# Patient Record
Sex: Male | Born: 1935 | Race: Black or African American | Hispanic: No | Smoking: Former smoker
Health system: Southern US, Community
[De-identification: ages and names within clinical notes are randomized; demographics above are authoritative.]

## PROBLEM LIST (undated history)

## (undated) DIAGNOSIS — D649 Anemia, unspecified: Secondary | ICD-10-CM

## (undated) DIAGNOSIS — D72819 Decreased white blood cell count, unspecified: Secondary | ICD-10-CM

## (undated) DIAGNOSIS — E119 Type 2 diabetes mellitus without complications: Secondary | ICD-10-CM

## (undated) DIAGNOSIS — I4821 Permanent atrial fibrillation: Secondary | ICD-10-CM

## (undated) DIAGNOSIS — I10 Essential (primary) hypertension: Secondary | ICD-10-CM

## (undated) DIAGNOSIS — D61818 Other pancytopenia: Secondary | ICD-10-CM

## (undated) DIAGNOSIS — I509 Heart failure, unspecified: Secondary | ICD-10-CM

## (undated) DIAGNOSIS — E785 Hyperlipidemia, unspecified: Secondary | ICD-10-CM

## (undated) DIAGNOSIS — K219 Gastro-esophageal reflux disease without esophagitis: Secondary | ICD-10-CM

## (undated) HISTORY — DX: Heart failure, unspecified: I50.9

## (undated) HISTORY — DX: Anemia, unspecified: D64.9

## (undated) HISTORY — DX: Essential (primary) hypertension: I10

## (undated) HISTORY — DX: Hyperlipidemia, unspecified: E78.5

## (undated) HISTORY — DX: Other pancytopenia: D61.818

## (undated) HISTORY — DX: Permanent atrial fibrillation: I48.21

## (undated) HISTORY — DX: Decreased white blood cell count, unspecified: D72.819

## (undated) HISTORY — DX: Gastro-esophageal reflux disease without esophagitis: K21.9

## (undated) HISTORY — DX: Type 2 diabetes mellitus without complications: E11.9

---

## 2010-05-22 DIAGNOSIS — I2510 Atherosclerotic heart disease of native coronary artery without angina pectoris: Secondary | ICD-10-CM | POA: Insufficient documentation

## 2010-05-22 DIAGNOSIS — K922 Gastrointestinal hemorrhage, unspecified: Secondary | ICD-10-CM | POA: Insufficient documentation

## 2010-05-22 DIAGNOSIS — D62 Acute posthemorrhagic anemia: Secondary | ICD-10-CM | POA: Insufficient documentation

## 2012-03-29 DIAGNOSIS — I4891 Unspecified atrial fibrillation: Secondary | ICD-10-CM | POA: Insufficient documentation

## 2012-03-29 DIAGNOSIS — R109 Unspecified abdominal pain: Secondary | ICD-10-CM | POA: Insufficient documentation

## 2012-03-29 DIAGNOSIS — E119 Type 2 diabetes mellitus without complications: Secondary | ICD-10-CM | POA: Insufficient documentation

## 2012-03-29 DIAGNOSIS — I10 Essential (primary) hypertension: Secondary | ICD-10-CM | POA: Insufficient documentation

## 2012-04-03 DIAGNOSIS — R001 Bradycardia, unspecified: Secondary | ICD-10-CM | POA: Insufficient documentation

## 2015-06-25 HISTORY — PX: OTHER SURGICAL HISTORY: SHX169

## 2015-07-15 DIAGNOSIS — D3A098 Benign carcinoid tumors of other sites: Secondary | ICD-10-CM | POA: Insufficient documentation

## 2015-07-15 DIAGNOSIS — R011 Cardiac murmur, unspecified: Secondary | ICD-10-CM | POA: Insufficient documentation

## 2015-10-22 ENCOUNTER — Telehealth: Payer: Self-pay | Admitting: Hematology

## 2015-10-22 NOTE — Telephone Encounter (Signed)
Unable to schedule appt due to records being forward to [place] main headquarters. Faxed a signed release to [facility] awaiting confirmation to obtain the records. Spoke with pt regarding the delay in appt. Due medical office closing and records being forwarded to main headquarters.

## 2015-10-30 ENCOUNTER — Encounter: Payer: Self-pay | Admitting: *Deleted

## 2015-10-31 ENCOUNTER — Telehealth: Payer: Self-pay | Admitting: Hematology

## 2015-10-31 ENCOUNTER — Encounter: Payer: Self-pay | Admitting: Cardiology

## 2015-10-31 NOTE — Telephone Encounter (Signed)
Telephone call to patient's son, Octavia Bruckner, to schedule an appointment. Appointment scheduled with Dr. Irene Limbo on 7/10 at [place] to arrive at 12:30pm. Son agreed. Address and location given.

## 2015-11-03 ENCOUNTER — Telehealth: Payer: Self-pay | Admitting: Hematology

## 2015-11-03 ENCOUNTER — Ambulatory Visit (HOSPITAL_BASED_OUTPATIENT_CLINIC_OR_DEPARTMENT_OTHER): Payer: Medicare FFS | Admitting: Hematology

## 2015-11-03 ENCOUNTER — Other Ambulatory Visit (HOSPITAL_BASED_OUTPATIENT_CLINIC_OR_DEPARTMENT_OTHER): Payer: Medicare FFS

## 2015-11-03 VITALS — BP 124/83 | HR 67 | Temp 97.7°F | Resp 18 | Ht 71.0 in | Wt 157.4 lb

## 2015-11-03 DIAGNOSIS — C7A019 Malignant carcinoid tumor of the small intestine, unspecified portion: Secondary | ICD-10-CM

## 2015-11-03 DIAGNOSIS — C7B09 Secondary carcinoid tumors of other sites: Secondary | ICD-10-CM

## 2015-11-03 DIAGNOSIS — D3A019 Benign carcinoid tumor of the small intestine, unspecified portion: Secondary | ICD-10-CM

## 2015-11-03 DIAGNOSIS — C7A00 Malignant carcinoid tumor of unspecified site: Secondary | ICD-10-CM

## 2015-11-03 DIAGNOSIS — J9859 Other diseases of mediastinum, not elsewhere classified: Secondary | ICD-10-CM

## 2015-11-03 LAB — CBC & DIFF AND RETIC
BASO%: 0.9 % (ref 0.0–2.0)
BASOS ABS: 0 10*3/uL (ref 0.0–0.1)
EOS ABS: 0.1 10*3/uL (ref 0.0–0.5)
EOS%: 4.3 % (ref 0.0–7.0)
HEMATOCRIT: 36 % — AB (ref 38.4–49.9)
HEMOGLOBIN: 11.4 g/dL — AB (ref 13.0–17.1)
IMMATURE RETIC FRACT: 5.7 % (ref 3.00–10.60)
LYMPH#: 0.8 10*3/uL — AB (ref 0.9–3.3)
LYMPH%: 32.6 % (ref 14.0–49.0)
MCH: 28.4 pg (ref 27.2–33.4)
MCHC: 31.7 g/dL — AB (ref 32.0–36.0)
MCV: 89.5 fL (ref 79.3–98.0)
MONO#: 0.3 10*3/uL (ref 0.1–0.9)
MONO%: 11.1 % (ref 0.0–14.0)
NEUT#: 1.2 10*3/uL — ABNORMAL LOW (ref 1.5–6.5)
NEUT%: 51.1 % (ref 39.0–75.0)
Platelets: 143 10*3/uL (ref 140–400)
RBC: 4.02 10*6/uL — ABNORMAL LOW (ref 4.20–5.82)
RDW: 14.7 % — AB (ref 11.0–14.6)
RETIC %: 0.61 % — AB (ref 0.80–1.80)
RETIC CT ABS: 24.52 10*3/uL — AB (ref 34.80–93.90)
WBC: 2.4 10*3/uL — ABNORMAL LOW (ref 4.0–10.3)

## 2015-11-03 LAB — COMPREHENSIVE METABOLIC PANEL
ALBUMIN: 3.8 g/dL (ref 3.5–5.0)
ALT: 16 U/L (ref 0–55)
AST: 18 U/L (ref 5–34)
Alkaline Phosphatase: 92 U/L (ref 40–150)
Anion Gap: 9 mEq/L (ref 3–11)
BUN: 18.4 mg/dL (ref 7.0–26.0)
CALCIUM: 8.8 mg/dL (ref 8.4–10.4)
CHLORIDE: 107 meq/L (ref 98–109)
CO2: 24 mEq/L (ref 22–29)
CREATININE: 1.3 mg/dL (ref 0.7–1.3)
EGFR: 59 mL/min/{1.73_m2} — ABNORMAL LOW (ref >90)
Glucose: 90 mg/dl (ref 70–140)
Potassium: 3.4 mEq/L — ABNORMAL LOW (ref 3.5–5.1)
Sodium: 141 mEq/L (ref 136–145)
Total Bilirubin: 0.71 mg/dL (ref 0.20–1.20)
Total Protein: 7 g/dL (ref 6.4–8.3)

## 2015-11-03 NOTE — Telephone Encounter (Signed)
Pt confirmed appt and received avs °

## 2015-11-04 ENCOUNTER — Encounter: Payer: Self-pay | Admitting: Cardiology

## 2015-11-05 ENCOUNTER — Encounter: Payer: Self-pay | Admitting: *Deleted

## 2015-11-05 LAB — SEROTONIN SERUM: SEROTONIN, SERUM: 109 ng/mL (ref 21–321)

## 2015-11-05 LAB — CHROMOGRANIN A: Chromogranin A: 6 nmol/L — ABNORMAL HIGH (ref 0–5)

## 2015-11-06 ENCOUNTER — Encounter: Payer: Self-pay | Admitting: Cardiology

## 2015-11-06 ENCOUNTER — Ambulatory Visit (INDEPENDENT_AMBULATORY_CARE_PROVIDER_SITE_OTHER): Payer: Medicare FFS | Admitting: Cardiology

## 2015-11-06 VITALS — BP 118/79 | HR 56 | Ht 71.0 in | Wt 158.0 lb

## 2015-11-06 DIAGNOSIS — I712 Thoracic aortic aneurysm, without rupture, unspecified: Secondary | ICD-10-CM

## 2015-11-06 DIAGNOSIS — I5022 Chronic systolic (congestive) heart failure: Secondary | ICD-10-CM

## 2015-11-06 DIAGNOSIS — Z136 Encounter for screening for cardiovascular disorders: Secondary | ICD-10-CM | POA: Diagnosis not present

## 2015-11-06 DIAGNOSIS — I480 Paroxysmal atrial fibrillation: Secondary | ICD-10-CM | POA: Diagnosis not present

## 2015-11-06 DIAGNOSIS — I340 Nonrheumatic mitral (valve) insufficiency: Secondary | ICD-10-CM

## 2015-11-06 MED ORDER — METOPROLOL SUCCINATE ER 50 MG PO TB24: 50.0000 mg | ORAL_TABLET | Freq: Every day | ORAL | Status: DC

## 2015-11-06 NOTE — Patient Instructions (Signed)
Your physician recommends that you schedule a follow-up appointment in: [place] DR. [place]  Your physician has recommended you make the following change in your medication:   STOP LOPRESSOR   START TOPROL XL 50 MG DAILY  Thank you for choosing [facility]!!

## 2015-11-06 NOTE — Progress Notes (Signed)
Clinical Summary [facility] is a 82 y.o.male seen today as a [facility] patient for the following medical problems. He is self referred.   1. Chronic systolic HF - admit [id number] with CHF to [facility], appears to be Thomas diagnosis -09/2015 echo [facility] LVEF 20-25%, moderate AI, modertate to severe MR,  - clinic note from 2013 mentions LVEF 55-60%, moderate MR.  - EKG LBBB of unknown duration.   - occasional LE edema. DOE at 1 block which is chronic, though also limited due to leg weakness - compliant with meds   2. Afib - long history of afib according to notes - recent afib with RVR during 09/2015 admission at [facility] - previous hematemsis when on anticoagulant in the past, has not been on  - mention of sick sinus syndrome in the past, the specificis are not well described.  - has some occasional palpitations. Approx once episode per month.   3. Pancytopenia  4. 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   5. Mitral regurgitation - long history of MR followed by previous cardiology - by echo 09/2015 reported moderate to severe   6. 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 Unclear if missed dilated portion.  Past Medical History  Diagnosis Date  . Congestive heart failure (CHF) ([facility])   . Atrial fibrillation, permanent ([facility])   . Pancytopenia ([facility])   . Chronic anemia   . Chronic leukopenia   . GERD (gastroesophageal reflux disease)   . Hypertension   . Diabetes ([facility])      No Known Allergies   Current Outpatient Prescriptions  Medication Sig Dispense Refill  . allopurinol (ZYLOPRIM) 300 MG tablet Take by mouth.    . folic acid (FOLVITE) 1 MG tablet Take by mouth.    [name] HYDROcodone-acetaminophen (NORCO/VICODIN) 5-325 MG tablet Take by mouth.    . magnesium hydroxide (MILK OF MAGNESIA) 400  MG/5ML suspension Take by mouth.    . magnesium oxide (MAG-OX) 400 MG tablet Take by mouth.    [name] omeprazole (PRILOSEC) 40 MG capsule Take by mouth.    . ranitidine (ZANTAC) 150 MG tablet Take by mouth.     No current facility-administered medications for this visit.     Past Surgical History  Procedure Laterality Date  . Carcinoid tumor resection  06/2015    carcinoid cancer of bowel s/p removal with significant mesentary metastasis     No Known Allergies    No family history on file.   Social History [name] reports that he has never smoked. He has never used smokeless tobacco. [name] has no alcohol history on file.   Review of Systems CONSTITUTIONAL: No weight loss, fever, chills, weakness or fatigue.  HEENT: Eyes: No visual loss, blurred vision, double vision or yellow sclerae.No hearing loss, sneezing, congestion, runny nose or sore throat.  SKIN: No rash or itching.  CARDIOVASCULAR: per HPI RESPIRATORY: per HPI GASTROINTESTINAL: No anorexia, nausea, vomiting or diarrhea. No abdominal pain or blood.  GENITOURINARY: No burning on urination, no polyuria NEUROLOGICAL: No headache, dizziness, syncope, paralysis, ataxia, numbness or tingling in the extremities. No change in bowel or bladder control.  MUSCULOSKELETAL: No muscle, back pain, joint pain or stiffness.  LYMPHATICS: No enlarged nodes. No history of splenectomy.  PSYCHIATRIC: No history of depression or anxiety.  ENDOCRINOLOGIC: No  reports of sweating, cold or heat intolerance. No polyuria or polydipsia.  [name]   Physical Examination Filed Vitals:   11/06/15 1341  BP: 118/79  Pulse: 56   Filed Vitals:   11/06/15 1341  Height: 5\' 11"  (1.803 m)  Weight: 158 lb (71.668 kg)    Gen: resting comfortably, no acute distress HEENT: no scleral icterus, pupils equal round and reactive, no palptable cervical adenopathy,  CV: RRR, 3/6 systolic murmur at apex no jvd Resp: Clear to auscultation bilaterally GI:  abdomen is soft, non-tender, non-distended, normal bowel sounds, no hepatosplenomegaly MSK: extremities are warm, no edema.  Skin: warm, no rash Neuro:  no focal deficits Psych: appropriate affect    Assessment and Plan  1. Chronic systolic HF - Thomas diagnosis during 09/2015 admission. Potential etiologies include ischemic, tachycardia mediated, MR related - current status of cancer diagnosis is unclear, he has a Thomas mediastinal mass that is being worked up. Would not pursue invasive ischemic testing at this time. EKG in clinic shows afib LBBB of unknown duration, increased potential for ICM - change lopressor to Toprol XL 50mg  daily in setting of systolic dysfunction. If tolerated start low dose entresto next visit.   2. Mitral regurgitation - moderate to severe by echo - significant MR prior to diagnosis of systolic dysfunction, probable valvular as opposed to funcitonal.  - continue to monitor at this time, given his comorbidities unlikely to be candidate for surgery at any time. F/u cancer workup   3. PAF - rate controlled. No anticoag due to previous severe GI bleed when on coumadin in the past. Look to obtain more infor about this. Unclear if its related to the carcinoid tumor he had that was removed.  4. Mediastinal mass - history of CA, he is establishing with [facility]. Follow up there evaluation  5. Aortic aneurysm - remote study seen in the [facility] records from 2010, I do not see any repeat imaging though his records are spread over several different sources - continue to monitor. As mentioned above unlikely to be candidate for intervention at any point   F/u 1 month     [facility], M.D.,

## 2015-11-17 ENCOUNTER — Other Ambulatory Visit: Payer: Self-pay | Admitting: *Deleted

## 2015-11-17 MED ORDER — FUROSEMIDE 20 MG PO TABS: 20.0000 mg | ORAL_TABLET | Freq: Every day | ORAL | 6 refills | Status: DC

## 2015-11-19 ENCOUNTER — Telehealth: Payer: Self-pay | Admitting: Hematology

## 2015-11-19 NOTE — Telephone Encounter (Signed)
Appointment confirmed with patient's son,Timothy Eulas Post. Appointment letter and schedule mailed per request. Merleen Nicely.

## 2015-11-20 ENCOUNTER — Encounter: Payer: Self-pay | Admitting: Thoracic Surgery (Cardiothoracic Vascular Surgery)

## 2015-11-20 ENCOUNTER — Institutional Professional Consult (permissible substitution) (INDEPENDENT_AMBULATORY_CARE_PROVIDER_SITE_OTHER): Payer: Medicare FFS | Admitting: Thoracic Surgery (Cardiothoracic Vascular Surgery)

## 2015-11-20 ENCOUNTER — Other Ambulatory Visit: Payer: Self-pay | Admitting: *Deleted

## 2015-11-20 VITALS — BP 116/80 | Resp 20 | Ht 71.0 in | Wt 158.0 lb

## 2015-11-20 DIAGNOSIS — J9859 Other diseases of mediastinum, not elsewhere classified: Secondary | ICD-10-CM | POA: Insufficient documentation

## 2015-11-20 DIAGNOSIS — I359 Nonrheumatic aortic valve disorder, unspecified: Secondary | ICD-10-CM | POA: Insufficient documentation

## 2015-11-20 DIAGNOSIS — E785 Hyperlipidemia, unspecified: Secondary | ICD-10-CM | POA: Insufficient documentation

## 2015-11-20 DIAGNOSIS — I10 Essential (primary) hypertension: Secondary | ICD-10-CM | POA: Insufficient documentation

## 2015-11-20 NOTE — Progress Notes (Signed)
PCP is Lonia Mad, MD Referring Provider is Brunetta Genera, MD  Chief Complaint  Patient presents with  . Mediastinal Mass    Surgical eval, [facility] Chest 10/20/15    HPI: 82 year old man sent for consultation regarding mediastinal mass.  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 He is an extremely poor historian and information was obtained from those records available in [place] and his son who accompanied him.  He recently had a CT chest showed a 4.4 x 6.5 x 5.4 cm anterior mediastinal mass. There also were some moderately enlarged mediastinal and right hilar lymph nodes.  He had lost 30 pounds prior to his abdominal surgery. He says he gained weight since then. He complains of frequent heartburn. He denies any exertional correlation. He denies chest pain, pressure, or tightness but his exercise is limited because his knees get "tired" when he walks. He has about a 25-pack-year history of smoking but quit 20 years ago. He denies double vision or progressive weakness. He had no flushing, diarrhea, or wheezing to suggest carcinoid syndrome. He has chronic atrial fibrillation. He had been on anticoagulation in the past but that was stopped due to gastrointestinal bleeding.  Zubrod Score: At the time of surgery this patient's most appropriate activity status/level should be described as: []     0    Normal activity, no symptoms [x]     1    Restricted in physical strenuous activity but ambulatory, able to do out light work []     2    Ambulatory and capable of self care, unable to do work activities, up and about >50 % of waking hours                              []     3    Only limited self care, in bed  greater than 50% of waking hours []     4    Completely disabled, no self care, confined to bed or chair []     5    Moribund    Past Medical History:  Diagnosis Date  . Atrial fibrillation, permanent ([place])   . Chronic anemia   . Chronic leukopenia   . Congestive heart failure (CHF) ([place])   . Diabetes ([place])   . GERD (gastroesophageal reflux disease)   . Hyperlipidemia   . Hypertension   . Pancytopenia [facility])     Past Surgical History:  Procedure Laterality Date  . CARCINOID TUMOR RESECTION  06/2015   carcinoid cancer of bowel s/p removal with significant mesentary metastasis    Family History  Problem Relation Age of Onset  . Family history unknown: Yes    Social History Social History  Substance Use Topics  . Smoking status: Former Smoker    Packs/day: 0.50    Years: 46.00    Types: Cigarettes    Start date: 10/27/1949    Quit date: 04/27/1995  . Smokeless tobacco: Never Used  . Alcohol use Not on file    Current Outpatient Prescriptions  Medication Sig Dispense Refill  . allopurinol (ZYLOPRIM) 300 MG tablet Take 300 mg by mouth daily.     [name] aspirin EC 81 MG tablet Take 81 mg by mouth daily.    [name]  docusate sodium (COLACE) 100 MG capsule Take 100-200 mg by mouth daily as needed for mild constipation.    . folic acid (FOLVITE) 1 MG tablet Take 1 mg by mouth daily.     . furosemide (LASIX) 20 MG tablet Take 1 tablet (20 mg total) by mouth daily. 30 tablet 6  . HYDROcodone-acetaminophen (NORCO/VICODIN) 5-325 MG tablet Take 1 tablet by mouth every 6 (six) hours as needed.     . magnesium oxide (MAG-OX) 400 MG tablet Take 400 mg by mouth as needed.    . metoprolol succinate (TOPROL-XL) 50 MG 24 hr tablet Take 1 tablet (50 mg total) by mouth daily. Take with or immediately following a meal. 90 tablet 3  . omeprazole (PRILOSEC) 40 MG capsule Take 40 mg by mouth daily.      No current facility-administered medications for this visit.     No Known Allergies  Review of  Systems  Constitutional: Negative for activity change, appetite change, chills, fever and unexpected weight change.  HENT: Negative for trouble swallowing and voice change.   Eyes: Negative for visual disturbance.       Denies diplopia  Respiratory: Positive for shortness of breath (With exertion). Negative for cough.   Cardiovascular: Negative for chest pain and leg swelling.  Gastrointestinal: Positive for abdominal pain (Frequent heartburn).       History GI bleed while on anticoagulation for atrial fibrillation  Genitourinary: Negative for difficulty urinating and frequency.  Musculoskeletal: Positive for arthralgias (Knees).       Complains of both knees getting "tired" when he walks  Neurological: Negative for syncope and weakness.  Hematological: Negative for adenopathy.    BP 116/80 (BP Location: Left Arm, Patient Position: Sitting, Cuff Size: Small)   Resp 20   Ht 5\' 11"  (1.803 m)   Wt 158 lb (71.7 kg)   SpO2 93% Comment: RA  BMI 22.04 kg/m  Physical Exam  Constitutional: He is oriented to person, place, and time.  Elderly man in no acute distress  HENT:  Mouth/Throat: No oropharyngeal exudate.  Eyes: Conjunctivae and EOM are normal. No scleral icterus.  Neck: Neck supple. No thyromegaly present.  Cardiovascular: Exam reveals gallop.   Irregularly irregular with variable S2. 3/6 systolic murmur, 1/6 diastolic murmur  Pulmonary/Chest: Effort normal and breath sounds normal. No respiratory distress. He has no wheezes. He has no rales.  Abdominal: Soft. He exhibits no distension. There is no tenderness.  Musculoskeletal: He exhibits no edema.  Lymphadenopathy:    He has no cervical adenopathy.  Neurological: He is alert and oriented to person, place, and time. No cranial nerve deficit. He exhibits normal muscle tone.  Skin: Skin is warm and dry.  Vitals reviewed.    Diagnostic Tests: CT angio chest from 10/20/2015@[facility] Impression: Negative for acute  pulmonary embolism. 2. Anterior mediastinal mass. Mildly prominent mediastinal and right hilar nodes. Instrument mediastinal mass this likely neoplastic. The leading considerations or lymphoma, sarcoma, and time. 3. Bilateral pleural effusions right larger than left, both enlarged from 10/18/2015. 4. Mildly enlarged thoracic aorta Andreas Newport M.D.  I personally reviewed the CT angiogram and concur with device noted above. Official report is ascending aortas reported 4.2 cm. It appears to be more 4.5 cm. There is no indication for surgery. Nature mediastinal masses 4.4 x 6.5 x 5.4 cm. He has a significant coronary artery calcification and marked cardiomegaly which were not mentioned in the report.  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 Intravenous cell mass is most likely a thymoma or lymphoma. Suspicion for lymphoma is raised by the enlarged mediastinal and hilar nodes. However those could be just reactive  He is a poor historian and is very hard to get a feel for his functional status. His activities are limited due to his knees.   He has chronic atrial fibrillation, severe MR, moderate AI, marked cardiomegaly, and ejection fraction of 20%, and coronary calcification on his chest CT. He would need cardiac clearance prior to any thoracic surgery. He saw Dr. Harl Bowie recently and all of his issues were felt to be stable.  My recommendation at this point would be to proceed with a PET/CT to get an idea of how active the anterior mediastinal mass is, and also more importantly, evaluate the hilar and mediastinal lymph nodes. That that would change how we might approach the initial diagnosis and treatment.  He needs pulmonary function testing.  Plan: PET/CT  Obtain records from Dr. Calton Dach  Obtain records from [facility] regarding abdominal surgery  Return in 2 weeks to  discuss results  Melrose Nakayama, MD [facility] [phone] has chronic

## 2015-11-23 NOTE — Progress Notes (Signed)
[name]    HEMATOLOGY/ONCOLOGY CONSULTATION NOTE  Date of Service: 11/23/2015  Patient Care Team: Lonia Mad, MD as PCP - General (Internal Medicine)  Surgeon Dr. Valentino Saxon  Cardiology : Dr.Branch  Son - Rodnie Middendorf -[phone]    CHIEF COMPLAINTS/PURPOSE OF CONSULTATION:  Abdominal Carcinoid tumor New mediastinal mass.  HISTORY OF PRESENTING ILLNESS:   Thomas Costa. is a wonderful 82 y.o. male who has been referred to [place] by Dr Tressie Stalker for continued care for his abdominal carcinoid .   patient presented in early March 2017 with abdominal pain, 30 pound weight loss over 6 months and increasing indigestion and abdominal pain. He also had nausea and vomiting. Patient was admitted to the hospital and had a CT of the abdomen that showed possible tumor in the mesentery. He was taken to surgery by Dr. Anthony Sar on 06/27/2015 and had resection of all visible tumor. He had partial small bowel resection a lymph node biopsy and he was found to have a tumor implant in the mesenteric tissue. The primary was 1.1 x 0.5-0.4 cm, he had one lymph node found to be involved and he had a 2.0 cm tumoral implant in the mesenteric tissue which was apparently resected. Patient had no symptoms of carcinoid syndrome.  He was seen in clinic on 07/21/2015 by Dr. Tressie Stalker at [facility] in [place], [place] and baseline labs were done at the time. CBC was noted to be normal with no anemia. CMP was within normal limits with no liver function abnormalities.  The actual pathology report and the baseline lab results were not available to [place].  Patient notes he healed well from his abdominal surgery. He has been taking in short and his oral appetite has improved and he feels that he is gaining back some weight. He notes that at its lowest his weight was 135 pounds and he is back up to 157.4 pounds now. Patient reports that his good baseline is 170 pounds.  Patient has significant systolic CHF and follows up with cardiology  he had a recent echocardiogram 10/20/2015 that showed left ventricular chamber size is moderately dilated, moderate concentric LVH with severely reduced left ventricular systolic function with an estimated ejection fraction of 20-25% . Also noted to have moderate to severe mitral regurgitation and moderate aortic regurgitation.  He presented to [facility] with acute dyspnea on 10/20/2015 and had a CTA of the chest with contrast that was negative for pulmonary embolism but an anterior mediastinal mass measuring 4.4 x 6.5 x 5.4 cm was noted with a few mildly prominent mediastinal lymph nodes measuring 1.5 cm and a mildly prominent right hilar lymph node. Considerations were lymphoma versus teratoma versus thymoma. Liver lesions were also apparently noted. No significant skeletal lesions noted. Bilateral pleural effusions right greater than left.  Prior to this the patient had a CT of the abdomen and pelvis with contrast on 10/18/2015 at [facility] for evaluation of generalized abdominal pain intermittently for the last 3 months. This showed postoperative changes in the abdomen with evidence of a partial small bowel resection and removal of the mediastinal mass. Small lymph nodes in the mesentery. Numerous low-density structures in the liver which probably represent cysts. Index lesion in the posterior left hepatic lobe measures 2.4 cm and stable. Noted to have gallbladder distention with some new pericholecystic fluid.  The CT scan result was provided to [place] after the patient's clinical visit. Images were requested and disc.    MEDICAL HISTORY:  Past Medical History:  Diagnosis Date  . Atrial fibrillation, permanent ([place])   . Chronic anemia   . Chronic leukopenia   . Congestive heart failure (CHF) ([place])   . Diabetes ([place])   . GERD (gastroesophageal reflux disease)   . Hyperlipidemia   . Hypertension   . 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 CARCINOID TUMOR RESECTION  06/2015   carcinoid cancer of bowel s/p removal with significant mesentary metastasis    SOCIAL HISTORY: Social History   Social History  . Marital status: Widowed    Spouse name: N/A  . Number of children: N/A  . Years of education: N/A   Occupational History  . Not on file.   Social History Main Topics  . Smoking status: Former Smoker    Packs/day: 0.50    Years: 46.00    Types: Cigarettes    Start date: 10/27/1949    Quit date: 04/27/1995  . Smokeless tobacco: Never Used  . Alcohol use Not on file  . Drug use: Unknown  . Sexual activity: Not on file   Other Topics Concern  . Not on file   Social History Narrative  . No narrative on file    FAMILY HISTORY: Family History  Problem Relation Age of Onset  . Family history unknown: Yes  Patient denies any family history of cancers   ALLERGIES:  has No Known Allergies.  MEDICATIONS:  Current Outpatient Prescriptions  Medication Sig Dispense Refill  . allopurinol (ZYLOPRIM) 300 MG tablet Take 300 mg by mouth daily.     [name] aspirin EC 81 MG tablet Take 81 mg by mouth daily.    [name] docusate sodium (COLACE) 100 MG capsule Take 100-200 mg by mouth daily as needed for mild constipation.    . folic acid (FOLVITE) 1 MG tablet Take 1 mg by mouth daily.     . furosemide (LASIX) 20 MG tablet Take 1 tablet (20 mg total) by mouth daily. 30 tablet 6  . HYDROcodone-acetaminophen (NORCO/VICODIN) 5-325 MG tablet Take 1 tablet by mouth every 6 (six) hours as needed.     . magnesium oxide (MAG-OX) 400 MG tablet Take 400 mg by mouth as needed.    . metoprolol succinate (TOPROL-XL) 50 MG 24 hr tablet Take 1 tablet (50 mg total) by mouth daily. Take with or immediately following a meal. 90 tablet 3  . omeprazole (PRILOSEC) 40 MG capsule Take 40 mg by mouth daily.      No current facility-administered medications for this visit.      REVIEW OF SYSTEMS:    10 Point review of Systems was done is negative except as noted above.  PHYSICAL EXAMINATION: ECOG PERFORMANCE STATUS: 2 - Symptomatic, <50% confined to bed  . Vitals:   11/03/15 1319  BP: 124/83  Pulse: 67  Resp: 18  Temp: 97.7 F (36.5 C)   Filed Weights   11/03/15 1319  Weight: 157 lb 6.4 oz (71.4 kg)   .Body mass index is 21.95 kg/m.  GENERAL:alert, in no acute distress and comfortable SKIN: skin color, texture, turgor are normal, no rashes or significant lesions EYES: normal, conjunctiva are pink and non-injected, sclera clear OROPHARYNX:no exudate, no erythema and lips, buccal mucosa, and tongue normal  NECK: supple, +JVD, thyroid normal size, non-tender, without nodularity LYMPH:  no palpable lymphadenopathy in the cervical, axillary or inguinal LUNGS: Bilateral decreased breath sounds no rales no  rhonchi  HEART: Irregular rhythm, systolic murmur and mitral and aortic areas  ABDOMEN: abdomen soft, non-tender, normoactive bowel sounds healed surgical scar Musculoskeletal: Bilateral 1+ pedal edema PSYCH: alert & oriented x 3 with fluent speech NEURO: no focal motor/sensory deficits  LABORATORY DATA:  I have reviewed the data as listed  . CBC Latest Ref Rng & Units 11/03/2015  WBC 4.0 - 10.3 10e3/uL 2.4(L)  Hemoglobin 13.0 - 17.1 g/dL 11.4(L)  Hematocrit 38.4 - 49.9 % 36.0(L)  Platelets 140 - 400 10e3/uL 143    . CMP Latest Ref Rng & Units 11/03/2015  Glucose 70 - 140 mg/dl 90  BUN 7.0 - 26.0 mg/dL 18.4  Creatinine 0.7 - 1.3 mg/dL 1.3  Sodium 136 - 145 mEq/L 141  Potassium 3.5 - 5.1 mEq/L 3.4(L)  CO2 22 - 29 mEq/L 24  Calcium 8.4 - 10.4 mg/dL 8.8  Total Protein 6.4 - 8.3 g/dL 7.0  Total Bilirubin 0.20 - 1.20 mg/dL 0.71  Alkaline Phos 40 - 150 U/L 92  AST 5 - 34 U/L 18  ALT 0 - 55 U/L 16     RADIOGRAPHIC STUDIES: I have personally reviewed the radiological images as listed and agreed with the findings in the report. No  results found.  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 Anthony Sar on 06/27/2015 at [place] [facility]. Noted to have one positive lymph node and extramural nodule as per outside report. Recent CT of the abdomen and pelvis showed no overt new local tumor. Some small mediastinal lymph nodes. The liver shows multiple lesions thought to be cysts as per radiologist but cannot be sure. Plan -We will need to get the actual pathology report from Dr. Jaclyn Prime office/[facility] -We will also need his baseline carcinoid tumor markers including 24 hour HIAA , serotonin, chromogranin A and neuron-specific enolase that were done in March 2017 at [facility].  -We shall repeat the serotonin and chromogranin A levels today  -  CT chest abdomen pelvis disks [facility] from [facility]   2) anterior mediastinal mass -4.4 x 6.5 x 5.4 cm was noted with a few mildly prominent mediastinal lymph nodes. Concerning for lymphoma. Uncertain that this represents a part of his carcinoid tumor though cannot be ruled out. Plan -We'll get a PET CT scan to evaluate this further. -We'll refer the patient to Dr. Roxan Hockey cardio vascular surgery to determine best option for tissue biopsy. We will need core biopsy for lymphoma workup. -We will also discuss the case with interventional radiology. -Further management as per tissue diagnosis.  -Given the patient's significant cardiac comorbidities he will need close follow-up with his cardiologist to optimize CHF management.  All of the patients questions were answered with apparent satisfaction. The patient knows to call the clinic with any problems, questions or concerns.  I spent 60 minutes counseling the patient face to face. The total time spent in the appointment was 70 minutes and more than 50% was on counseling and  direct patient cares.    Sullivan Lone MD [place] AAHIVMS [facility] Hematology/Oncology Physician [facility]  (Office):       [phone] (Work cell):  [phone] (Fax):           [phone]

## 2015-11-24 ENCOUNTER — Ambulatory Visit (HOSPITAL_COMMUNITY)
Admission: RE | Admit: 2015-11-24 | Discharge: 2015-11-24 | Disposition: A | Discharge status: Home / Self Care | Payer: Medicare FFS | Source: Ambulatory Visit | Attending: Hematology | Admitting: Hematology

## 2015-11-24 ENCOUNTER — Ambulatory Visit (HOSPITAL_COMMUNITY)
Admission: RE | Admit: 2015-11-24 | Discharge: 2015-11-24 | Disposition: A | Discharge status: Home / Self Care | Payer: Medicare FFS | Source: Ambulatory Visit | Attending: Thoracic Surgery (Cardiothoracic Vascular Surgery) | Admitting: Thoracic Surgery (Cardiothoracic Vascular Surgery)

## 2015-11-24 DIAGNOSIS — K219 Gastro-esophageal reflux disease without esophagitis: Secondary | ICD-10-CM | POA: Insufficient documentation

## 2015-11-24 DIAGNOSIS — D61818 Other pancytopenia: Secondary | ICD-10-CM | POA: Insufficient documentation

## 2015-11-24 DIAGNOSIS — I509 Heart failure, unspecified: Secondary | ICD-10-CM | POA: Diagnosis not present

## 2015-11-24 DIAGNOSIS — F17210 Nicotine dependence, cigarettes, uncomplicated: Secondary | ICD-10-CM | POA: Diagnosis not present

## 2015-11-24 DIAGNOSIS — J9859 Other diseases of mediastinum, not elsewhere classified: Secondary | ICD-10-CM

## 2015-11-24 DIAGNOSIS — J3489 Other specified disorders of nose and nasal sinuses: Secondary | ICD-10-CM | POA: Insufficient documentation

## 2015-11-24 DIAGNOSIS — D3A019 Benign carcinoid tumor of the small intestine, unspecified portion: Secondary | ICD-10-CM

## 2015-11-24 DIAGNOSIS — R222 Localized swelling, mass and lump, trunk: Secondary | ICD-10-CM | POA: Diagnosis not present

## 2015-11-24 DIAGNOSIS — R05 Cough: Secondary | ICD-10-CM | POA: Insufficient documentation

## 2015-11-24 DIAGNOSIS — E785 Hyperlipidemia, unspecified: Secondary | ICD-10-CM | POA: Diagnosis not present

## 2015-11-24 DIAGNOSIS — D6489 Other specified anemias: Secondary | ICD-10-CM | POA: Diagnosis not present

## 2015-11-24 DIAGNOSIS — R590 Localized enlarged lymph nodes: Secondary | ICD-10-CM | POA: Insufficient documentation

## 2015-11-24 DIAGNOSIS — I110 Hypertensive heart disease with heart failure: Secondary | ICD-10-CM | POA: Insufficient documentation

## 2015-11-24 LAB — PULMONARY FUNCTION TEST
DL/VA % pred: 69 %
DL/VA: 3.21 ml/min/mmHg/L
DLCO UNC % PRED: 49 %
DLCO unc: 16.56 ml/min/mmHg
FEF 25-75 PRE: 0.65 L/s
FEF 25-75 Post: 0.75 L/sec
FEF2575-%CHANGE-POST: 15 %
FEF2575-%Pred-Post: 35 %
FEF2575-%Pred-Pre: 30 %
FEV1-%Change-Post: 6 %
FEV1-%PRED-POST: 72 %
FEV1-%Pred-Pre: 67 %
FEV1-POST: 1.99 L
FEV1-PRE: 1.87 L
FEV1FVC-%CHANGE-POST: 8 %
FEV1FVC-%Pred-Pre: 76 %
FEV6-%CHANGE-POST: 0 %
FEV6-%PRED-PRE: 84 %
FEV6-%Pred-Post: 84 %
FEV6-POST: 3.02 L
FEV6-PRE: 3.01 L
FEV6FVC-%Change-Post: 2 %
FEV6FVC-%PRED-POST: 99 %
FEV6FVC-%PRED-PRE: 96 %
FVC-%CHANGE-POST: -2 %
FVC-%Pred-Post: 85 %
FVC-%Pred-Pre: 87 %
FVC-POST: 3.24 L
FVC-Pre: 3.31 L
POST FEV6/FVC RATIO: 93 %
Post FEV1/FVC ratio: 62 %
Pre FEV1/FVC ratio: 57 %
Pre FEV6/FVC Ratio: 91 %
RV % PRED: 116 %
RV: 3.19 L
TLC % PRED: 90 %
TLC: 6.55 L

## 2015-11-24 LAB — GLUCOSE, CAPILLARY: GLUCOSE-CAPILLARY: 91 mg/dL (ref 65–99)

## 2015-11-24 MED ORDER — ALBUTEROL SULFATE (2.5 MG/3ML) 0.083% IN NEBU
2.5000 mg | INHALATION_SOLUTION | Freq: Once | RESPIRATORY_TRACT | Status: AC
  Administered 2015-11-24: 2.5 mg via RESPIRATORY_TRACT

## 2015-11-24 MED ORDER — FLUDEOXYGLUCOSE F - 18 (FDG) INJECTION: 8.5000 | Freq: Once | INTRAVENOUS | Status: DC | PRN

## 2015-11-28 ENCOUNTER — Telehealth: Payer: Self-pay | Admitting: Cardiology

## 2015-11-28 ENCOUNTER — Encounter: Payer: Self-pay | Admitting: Cardiology

## 2015-11-28 ENCOUNTER — Ambulatory Visit (INDEPENDENT_AMBULATORY_CARE_PROVIDER_SITE_OTHER): Payer: Medicare FFS | Admitting: Cardiology

## 2015-11-28 ENCOUNTER — Encounter: Payer: Self-pay | Admitting: *Deleted

## 2015-11-28 ENCOUNTER — Other Ambulatory Visit: Payer: Self-pay | Admitting: Cardiology

## 2015-11-28 VITALS — BP 127/80 | HR 92 | Ht 71.0 in | Wt 158.0 lb

## 2015-11-28 DIAGNOSIS — I340 Nonrheumatic mitral (valve) insufficiency: Secondary | ICD-10-CM | POA: Diagnosis not present

## 2015-11-28 DIAGNOSIS — I480 Paroxysmal atrial fibrillation: Secondary | ICD-10-CM

## 2015-11-28 DIAGNOSIS — I5022 Chronic systolic (congestive) heart failure: Secondary | ICD-10-CM

## 2015-11-28 DIAGNOSIS — I5021 Acute systolic (congestive) heart failure: Secondary | ICD-10-CM

## 2015-11-28 MED ORDER — SACUBITRIL-VALSARTAN 24-26 MG PO TABS: 1.0000 | ORAL_TABLET | Freq: Two times a day (BID) | ORAL | 3 refills | Status: DC

## 2015-11-28 MED ORDER — SACUBITRIL-VALSARTAN 24-26 MG PO TABS: 1.0000 | ORAL_TABLET | Freq: Two times a day (BID) | ORAL | 0 refills | Status: DC

## 2015-11-28 NOTE — Patient Instructions (Signed)
Your physician recommends that you schedule a follow-up appointment in: 3-4 WEEKS WITH DR. [place]  Your physician has recommended you make the following change in your medication:   START ENTRESTO 24/26 MG TWICE DAILY  WE HAVE GIVEN YOU SAMPLES  Your physician has requested that you have a cardiac catheterization. Cardiac catheterization is used to diagnose and/or treat various heart conditions. Doctors may recommend this procedure for a number of different reasons. The most common reason is to evaluate chest pain. Chest pain can be a symptom of coronary artery disease (CAD), and cardiac catheterization can show whether plaque is narrowing or blocking your heart's arteries. This procedure is also used to evaluate the valves, as well as measure the blood flow and oxygen levels in different parts of your heart. For further information please visit [email or web address]. Please follow instruction sheet, as given.  Thank you for choosing [facility]!!

## 2015-11-28 NOTE — Telephone Encounter (Signed)
L&RHC 12/03/15 @ 10am DR. [place]  Checking percert

## 2015-11-28 NOTE — Progress Notes (Signed)
Clinical Summary Mr. Goldner is a 82 y.o.male seen today for follow up of the following medical problems.   1. Chronic systolic HF - admit [id number] with CHF to [facility], appears to be new diagnosis -09/2015 echo [facility] LVEF 20-25%, moderate AI, modertate to severe MR,  - clinic note from 2013 mentions LVEF 55-60%, moderate MR.  - EKG LBBB of unknown duration.   - occasional LE edema but overall controlled with diuretics. . DOE at 1 block which is chronic    2. Afib - long history of afib according to notes - recent afib with RVR during 09/2015 admission at [facility] - previous hematemsis when on anticoagulant in the past, has not been on  - mention of sick sinus syndrome in the past, the specificis are not well described.   - has some occasional palpitations, overall infrequent and mild.    4. 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   6 Mitral regurgitation - long history of MR followed by previous cardiology - by echo 09/2015 reported moderate to severe   7. 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 Unclear if missed dilated portion.  - CT PE [facility] 09/2015 with mildy dilated ascedning aorta at 4.2 cm.  Past Medical History:  Diagnosis Date  . Atrial fibrillation, permanent ([place])   . Chronic anemia   . Chronic leukopenia   . Congestive heart failure (CHF) ([place])   . Diabetes ([place])   . GERD (gastroesophageal reflux disease)   . Hyperlipidemia   . Hypertension   . Pancytopenia ([place])      No Known Allergies   Current Outpatient Prescriptions  Medication Sig Dispense Refill  . allopurinol (ZYLOPRIM) 300 MG tablet Take 300 mg by mouth daily.     [name] aspirin EC 81 MG tablet Take 81 mg by mouth daily.    [name] docusate sodium (COLACE)  100 MG capsule Take 100-200 mg by mouth daily as needed for mild constipation.    . folic acid (FOLVITE) 1 MG tablet Take 1 mg by mouth daily.     . furosemide (LASIX) 20 MG tablet Take 1 tablet (20 mg total) by mouth daily. 30 tablet 6  . HYDROcodone-acetaminophen (NORCO/VICODIN) 5-325 MG tablet Take 1 tablet by mouth every 6 (six) hours as needed.     . magnesium oxide (MAG-OX) 400 MG tablet Take 400 mg by mouth as needed.    . metoprolol succinate (TOPROL-XL) 50 MG 24 hr tablet Take 1 tablet (50 mg total) by mouth daily. Take with or immediately following a meal. 90 tablet 3  . omeprazole (PRILOSEC) 40 MG capsule Take 40 mg by mouth daily.      No current facility-administered medications for this visit.    Facility-Administered Medications Ordered in Other Visits  Medication Dose Route Frequency Provider Last Rate Last Dose  . fludeoxyglucose F - 18 (FDG) injection 8.5 millicurie  8.5 millicurie Intravenous Once PRN [employer]., MD         Past Surgical History:  Procedure Laterality Date  . CARCINOID TUMOR RESECTION  06/2015   carcinoid cancer of bowel s/p removal with significant mesentary metastasis     No Known Allergies    Family History  Problem Relation Age of Onset  . Family  history unknown: Yes     Social History Mr. Diane reports that he quit smoking about 20 years ago. His smoking use included Cigarettes. He started smoking about 66 years ago. He has a 23.00 pack-year smoking history. He has never used smokeless tobacco. Mr. Michaelson has no alcohol history on file.   Review of Systems CONSTITUTIONAL: No weight loss, fever, chills, weakness or fatigue.  HEENT: Eyes: No visual loss, blurred vision, double vision or yellow sclerae.No hearing loss, sneezing, congestion, runny nose or sore throat.  SKIN: No rash or itching.  CARDIOVASCULAR: per HPI RESPIRATORY: No shortness of breath, cough or sputum.  GASTROINTESTINAL: No anorexia, nausea, vomiting or  diarrhea. No abdominal pain or blood.  GENITOURINARY: No burning on urination, no polyuria NEUROLOGICAL: No headache, dizziness, syncope, paralysis, ataxia, numbness or tingling in the extremities. No change in bowel or bladder control.  MUSCULOSKELETAL: No muscle, back pain, joint pain or stiffness.  LYMPHATICS: No enlarged nodes. No history of splenectomy.  PSYCHIATRIC: No history of depression or anxiety.  ENDOCRINOLOGIC: No reports of sweating, cold or heat intolerance. No polyuria or polydipsia.  [name]   Physical Examination Vitals:   11/28/15 1523  BP: 127/80  Pulse: 92   Vitals:   11/28/15 1523  Weight: 158 lb (71.7 kg)  Height: 5\' 11"  (1.803 m)    Gen: resting comfortably, no acute distress HEENT: no scleral icterus, pupils equal round and reactive, no palptable cervical adenopathy,  CV: RRR, no m/r/g, no jvd Resp: Clear to auscultation bilaterally GI: abdomen is soft, non-tender, non-distended, normal bowel sounds, no hepatosplenomegaly MSK: extremities are warm, no edema.  Skin: warm, no rash Neuro:  no focal deficits Psych: appropriate affect      Assessment and Plan  1. Chronic systolic HF - new diagnosis during 09/2015 admission. Potential etiologies include ischemic, tachycardia mediated, MR related - current status of cancer diagnosis is unclear, he has a new mediastinal mass that is being worked up.  - we are asked to assess his cardiac risk prior to CT surgery biopsy or resection under general anesthesia - we will plan for diagnostic  LHC/RHC to further evaluate his dysfunction and risk stratify for surgery, avoid any intervention at this time due to neccesity for upcoming chest surgery. He has not had any acute cardiac symptoms.  - start entresto 24/26 bid.    2. Mitral regurgitation - moderate to severe by echo - continue to monitor at this time.   3. PAF - rate controlled. No anticoag due to previous severe GI bleed when on coumadin in the past. It  looks like this may had been prior to the diagnosis of his colon mass that has since been removed, may consider repeat trial of anticoag in the future.   4. Mediastinal mass - concerning for malignancy, he is being considered for biopsy or resection by CT surgery.   5. Aortic aneurysm - remote study seen in the [facility] records from 2010. More recent imaging CT PE 09/2015 shows ascending aorta at 4.2 cm. Continue to monitor.    F/u 3-4 weeks  I have reviewed the risks, indications, and alternatives to cardiac catheterization, possible angioplasty, and stenting with the patient and his son today. Risks include but are not limited to bleeding, infection, vascular injury, stroke, myocardial infection, arrhythmia, kidney injury, radiation-related injury in the case of prolonged fluoroscopy use, emergency cardiac surgery, and death. The patient understands the risks of serious complication is 1-2 in [id number] with diagnostic cardiac cath and 1-2% or  less with angioplasty/stenting.        Arnoldo Lenis, M.D.

## 2015-12-03 ENCOUNTER — Telehealth: Payer: Self-pay | Admitting: *Deleted

## 2015-12-03 ENCOUNTER — Encounter (HOSPITAL_COMMUNITY)
Admission: RE | Disposition: A | Discharge status: Home / Self Care | Payer: Self-pay | Source: Ambulatory Visit | Attending: Cardiology

## 2015-12-03 ENCOUNTER — Encounter (HOSPITAL_COMMUNITY): Payer: Self-pay | Admitting: Cardiology

## 2015-12-03 ENCOUNTER — Ambulatory Visit (HOSPITAL_COMMUNITY)
Admission: RE | Admit: 2015-12-03 | Discharge: 2015-12-03 | Disposition: A | Discharge status: Home / Self Care | Payer: Medicare FFS | Source: Ambulatory Visit | Attending: Cardiology | Admitting: Cardiology

## 2015-12-03 DIAGNOSIS — Z87891 Personal history of nicotine dependence: Secondary | ICD-10-CM | POA: Insufficient documentation

## 2015-12-03 DIAGNOSIS — J9859 Other diseases of mediastinum, not elsewhere classified: Secondary | ICD-10-CM | POA: Diagnosis present

## 2015-12-03 DIAGNOSIS — C7A00 Malignant carcinoid tumor of unspecified site: Secondary | ICD-10-CM | POA: Diagnosis present

## 2015-12-03 DIAGNOSIS — K219 Gastro-esophageal reflux disease without esophagitis: Secondary | ICD-10-CM | POA: Diagnosis not present

## 2015-12-03 DIAGNOSIS — D539 Nutritional anemia, unspecified: Secondary | ICD-10-CM | POA: Insufficient documentation

## 2015-12-03 DIAGNOSIS — I2510 Atherosclerotic heart disease of native coronary artery without angina pectoris: Secondary | ICD-10-CM | POA: Diagnosis not present

## 2015-12-03 DIAGNOSIS — I110 Hypertensive heart disease with heart failure: Secondary | ICD-10-CM | POA: Diagnosis present

## 2015-12-03 DIAGNOSIS — I340 Nonrheumatic mitral (valve) insufficiency: Secondary | ICD-10-CM | POA: Diagnosis not present

## 2015-12-03 DIAGNOSIS — I447 Left bundle-branch block, unspecified: Secondary | ICD-10-CM | POA: Insufficient documentation

## 2015-12-03 DIAGNOSIS — I482 Chronic atrial fibrillation: Secondary | ICD-10-CM | POA: Insufficient documentation

## 2015-12-03 DIAGNOSIS — C7B09 Secondary carcinoid tumors of other sites: Secondary | ICD-10-CM

## 2015-12-03 DIAGNOSIS — I480 Paroxysmal atrial fibrillation: Secondary | ICD-10-CM | POA: Insufficient documentation

## 2015-12-03 DIAGNOSIS — Z7982 Long term (current) use of aspirin: Secondary | ICD-10-CM | POA: Diagnosis not present

## 2015-12-03 DIAGNOSIS — E785 Hyperlipidemia, unspecified: Secondary | ICD-10-CM | POA: Diagnosis present

## 2015-12-03 DIAGNOSIS — E119 Type 2 diabetes mellitus without complications: Secondary | ICD-10-CM | POA: Diagnosis not present

## 2015-12-03 DIAGNOSIS — I5022 Chronic systolic (congestive) heart failure: Secondary | ICD-10-CM | POA: Diagnosis not present

## 2015-12-03 DIAGNOSIS — I10 Essential (primary) hypertension: Secondary | ICD-10-CM | POA: Diagnosis present

## 2015-12-03 DIAGNOSIS — C7A029 Malignant carcinoid tumor of the large intestine, unspecified portion: Secondary | ICD-10-CM | POA: Insufficient documentation

## 2015-12-03 DIAGNOSIS — I5021 Acute systolic (congestive) heart failure: Secondary | ICD-10-CM

## 2015-12-03 DIAGNOSIS — I359 Nonrheumatic aortic valve disorder, unspecified: Secondary | ICD-10-CM | POA: Diagnosis present

## 2015-12-03 HISTORY — PX: CARDIAC CATHETERIZATION: SHX172

## 2015-12-03 HISTORY — PX: PERIPHERAL VASCULAR CATHETERIZATION: SHX172C

## 2015-12-03 LAB — POCT I-STAT 3, ART BLOOD GAS (G3+)
Acid-base deficit: 3 mmol/L — ABNORMAL HIGH (ref 0.0–2.0)
BICARBONATE: 20.9 meq/L (ref 20.0–24.0)
O2 SAT: 94 %
PCO2 ART: 34.6 mmHg — AB (ref 35.0–45.0)
PH ART: 7.389 (ref 7.350–7.450)
PO2 ART: 71 mmHg — AB (ref 80.0–100.0)
TCO2: 22 mmol/L (ref 0–100)

## 2015-12-03 LAB — POCT I-STAT 3, VENOUS BLOOD GAS (G3P V)
ACID-BASE DEFICIT: 3 mmol/L — AB (ref 0.0–2.0)
BICARBONATE: 22 meq/L (ref 20.0–24.0)
O2 Saturation: 57 %
PCO2 VEN: 38.9 mmHg — AB (ref 45.0–50.0)
PH VEN: 7.36 — AB (ref 7.250–7.300)
PO2 VEN: 31 mmHg (ref 31.0–45.0)
TCO2: 23 mmol/L (ref 0–100)

## 2015-12-03 LAB — BASIC METABOLIC PANEL
Anion gap: 9 (ref 5–15)
BUN: 13 mg/dL (ref 6–20)
CALCIUM: 8.6 mg/dL — AB (ref 8.9–10.3)
CHLORIDE: 104 mmol/L (ref 101–111)
CO2: 24 mmol/L (ref 22–32)
CREATININE: 1.36 mg/dL — AB (ref 0.61–1.24)
GFR calc non Af Amer: 47 mL/min — ABNORMAL LOW (ref >60)
GFR, EST AFRICAN AMERICAN: 55 mL/min — AB (ref >60)
Glucose, Bld: 106 mg/dL — ABNORMAL HIGH (ref 65–99)
Potassium: 3.2 mmol/L — ABNORMAL LOW (ref 3.5–5.1)
Sodium: 137 mmol/L (ref 135–145)

## 2015-12-03 LAB — CBC
HCT: 38.4 % — ABNORMAL LOW (ref 39.0–52.0)
Hemoglobin: 12.3 g/dL — ABNORMAL LOW (ref 13.0–17.0)
MCH: 28.5 pg (ref 26.0–34.0)
MCHC: 32 g/dL (ref 30.0–36.0)
MCV: 88.9 fL (ref 78.0–100.0)
PLATELETS: 136 10*3/uL — AB (ref 150–400)
RBC: 4.32 MIL/uL (ref 4.22–5.81)
RDW: 14.1 % (ref 11.5–15.5)
WBC: 2.5 10*3/uL — AB (ref 4.0–10.5)

## 2015-12-03 LAB — PROTIME-INR
INR: 1.13
PROTHROMBIN TIME: 14.5 s (ref 11.4–15.2)

## 2015-12-03 LAB — GLUCOSE, CAPILLARY: GLUCOSE-CAPILLARY: 78 mg/dL (ref 65–99)

## 2015-12-03 SURGERY — RIGHT/LEFT HEART CATH AND CORONARY ANGIOGRAPHY
Anesthesia: LOCAL

## 2015-12-03 MED ORDER — MIDAZOLAM HCL 2 MG/2ML IJ SOLN
INTRAMUSCULAR | Status: AC
  Filled 2015-12-03: qty 2

## 2015-12-03 MED ORDER — HEPARIN SODIUM (PORCINE) 1000 UNIT/ML IJ SOLN
INTRAMUSCULAR | Status: AC
  Filled 2015-12-03: qty 1

## 2015-12-03 MED ORDER — ASPIRIN 81 MG PO CHEW: 81.0000 mg | CHEWABLE_TABLET | ORAL | Status: DC

## 2015-12-03 MED ORDER — SODIUM CHLORIDE 0.9 % WEIGHT BASED INFUSION: 3.0000 mL/kg/h | INTRAVENOUS | Status: DC

## 2015-12-03 MED ORDER — SODIUM CHLORIDE 0.9% FLUSH: 3.0000 mL | Freq: Two times a day (BID) | INTRAVENOUS | Status: DC

## 2015-12-03 MED ORDER — IOPAMIDOL (ISOVUE-370) INJECTION 76%
INTRAVENOUS | Status: DC | PRN
  Administered 2015-12-03: 100 mL

## 2015-12-03 MED ORDER — HEPARIN (PORCINE) IN NACL 2-0.9 UNIT/ML-% IJ SOLN
INTRAMUSCULAR | Status: DC | PRN
  Administered 2015-12-03: 09:00:00 via INTRA_ARTERIAL

## 2015-12-03 MED ORDER — SODIUM CHLORIDE 0.9 % IV SOLN: 250.0000 mL | INTRAVENOUS | Status: DC | PRN

## 2015-12-03 MED ORDER — SODIUM CHLORIDE 0.9 % IV SOLN: INTRAVENOUS | Status: DC

## 2015-12-03 MED ORDER — POTASSIUM CHLORIDE CRYS ER 20 MEQ PO TBCR
20.0000 meq | EXTENDED_RELEASE_TABLET | Freq: Once | ORAL | Status: AC
  Administered 2015-12-03: 20 meq via ORAL
  Filled 2015-12-03: qty 1

## 2015-12-03 MED ORDER — LIDOCAINE HCL (PF) 1 % IJ SOLN
INTRAMUSCULAR | Status: DC | PRN
  Administered 2015-12-03 (×2): 2 mL

## 2015-12-03 MED ORDER — HEPARIN (PORCINE) IN NACL 2-0.9 UNIT/ML-% IJ SOLN
INTRAMUSCULAR | Status: DC | PRN
  Administered 2015-12-03: 1500 mL

## 2015-12-03 MED ORDER — SODIUM CHLORIDE 0.9% FLUSH: 3.0000 mL | INTRAVENOUS | Status: DC | PRN

## 2015-12-03 MED ORDER — SODIUM CHLORIDE 0.9% FLUSH
3.0000 mL | Freq: Two times a day (BID) | INTRAVENOUS | Status: DC
Start: 2015-12-03 — End: 2015-12-03

## 2015-12-03 MED ORDER — HEPARIN SODIUM (PORCINE) 1000 UNIT/ML IJ SOLN
INTRAMUSCULAR | Status: DC | PRN
  Administered 2015-12-03: 3500 [IU] via INTRAVENOUS

## 2015-12-03 MED ORDER — MIDAZOLAM HCL 2 MG/2ML IJ SOLN
INTRAMUSCULAR | Status: DC | PRN
  Administered 2015-12-03: 1 mg via INTRAVENOUS

## 2015-12-03 MED ORDER — FENTANYL CITRATE (PF) 100 MCG/2ML IJ SOLN
INTRAMUSCULAR | Status: DC | PRN
  Administered 2015-12-03: 25 ug via INTRAVENOUS

## 2015-12-03 MED ORDER — HEPARIN (PORCINE) IN NACL 2-0.9 UNIT/ML-% IJ SOLN
INTRAMUSCULAR | Status: AC
  Filled 2015-12-03: qty 1500

## 2015-12-03 MED ORDER — IOPAMIDOL (ISOVUE-370) INJECTION 76%
INTRAVENOUS | Status: AC
  Filled 2015-12-03: qty 100

## 2015-12-03 MED ORDER — POTASSIUM CHLORIDE CRYS ER 20 MEQ PO TBCR
EXTENDED_RELEASE_TABLET | ORAL | Status: AC
  Administered 2015-12-03: 20 meq via ORAL
  Filled 2015-12-03: qty 1

## 2015-12-03 MED ORDER — LIDOCAINE HCL (PF) 1 % IJ SOLN
INTRAMUSCULAR | Status: AC
  Filled 2015-12-03: qty 30

## 2015-12-03 MED ORDER — FENTANYL CITRATE (PF) 100 MCG/2ML IJ SOLN
INTRAMUSCULAR | Status: AC
  Filled 2015-12-03: qty 2

## 2015-12-03 MED ORDER — VERAPAMIL HCL 2.5 MG/ML IV SOLN
INTRAVENOUS | Status: AC
  Filled 2015-12-03: qty 2

## 2015-12-03 SURGICAL SUPPLY — 15 items
CATH BALLN WEDGE 5F 110CM (CATHETERS) ×2 IMPLANT
CATH INFINITI 5 FR JL3.5 (CATHETERS) ×2 IMPLANT
CATH INFINITI 5FR ANG PIGTAIL (CATHETERS) ×2 IMPLANT
CATH INFINITI 5FR JL4 (CATHETERS) ×2 IMPLANT
CATH INFINITI JR4 5F (CATHETERS) ×2 IMPLANT
DEVICE RAD COMP TR BAND LRG (VASCULAR PRODUCTS) ×2 IMPLANT
GLIDESHEATH SLEND SS 6F .021 (SHEATH) ×2 IMPLANT
KIT HEART LEFT (KITS) ×2 IMPLANT
PACK CARDIAC CATHETERIZATION (CUSTOM PROCEDURE TRAY) ×2 IMPLANT
SHEATH FAST CATH BRACH 5F 5CM (SHEATH) ×2 IMPLANT
SYR MEDRAD MARK V 150ML (SYRINGE) ×2 IMPLANT
TRANSDUCER W/STOPCOCK (MISCELLANEOUS) ×2 IMPLANT
TUBING CIL FLEX 10 FLL-RA (TUBING) ×2 IMPLANT
WIRE EMERALD ST .035X150CM (WIRE) ×2 IMPLANT
WIRE SAFE-T 1.5MM-J .035X260CM (WIRE) ×2 IMPLANT

## 2015-12-03 NOTE — Discharge Instructions (Signed)
Radial Site Care °Refer to this sheet in the next few weeks. These instructions provide you with information about caring for yourself after your procedure. Your health care provider may also give you more specific instructions. Your treatment has been planned according to current medical practices, but problems sometimes occur. Call your health care provider if you have any problems or questions after your procedure. °WHAT TO EXPECT AFTER THE PROCEDURE °After your procedure, it is typical to have the following: °· Bruising at the radial site that usually fades within 1-2 weeks. °· Blood collecting in the tissue (hematoma) that may be painful to the touch. It should usually decrease in size and tenderness within 1-2 weeks. °HOME CARE INSTRUCTIONS °· Take medicines only as directed by your health care provider. °· You may shower 24-48 hours after the procedure or as directed by your health care provider. Remove the bandage (dressing) and gently wash the site with plain soap and water. Pat the area dry with a clean towel. Do not rub the site, because this may cause bleeding. °· Do not take baths, swim, or use a hot tub until your health care provider approves. °· Check your insertion site every day for redness, swelling, or drainage. °· Do not apply powder or lotion to the site. °· Do not flex or bend the affected arm for 24 hours or as directed by your health care provider. °· Do not push or pull heavy objects with the affected arm for 24 hours or as directed by your health care provider. °· Do not lift over 10 lb (4.5 kg) for 5 days after your procedure or as directed by your health care provider. °· Ask your health care provider when it is okay to: °¨ Return to work or school. °¨ Resume usual physical activities or sports. °¨ Resume sexual activity. °· Do not drive home if you are discharged the same day as the procedure. Have someone else drive you. °· You may drive 24 hours after the procedure unless otherwise  instructed by your health care provider. °· Do not operate machinery or power tools for 24 hours after the procedure. °· If your procedure was done as an outpatient procedure, which means that you went home the same day as your procedure, a responsible adult should be with you for the first 24 hours after you arrive home. °· Keep all follow-up visits as directed by your health care provider. This is important. °SEEK MEDICAL CARE IF: °· You have a fever. °· You have chills. °· You have increased bleeding from the radial site. Hold pressure on the site. °SEEK IMMEDIATE MEDICAL CARE IF: °· You have unusual pain at the radial site. °· You have redness, warmth, or swelling at the radial site. °· You have drainage (other than a small amount of blood on the dressing) from the radial site. °· The radial site is bleeding, and the bleeding does not stop after 30 minutes of holding steady pressure on the site. °· Your arm or hand becomes pale, cool, tingly, or numb. °  °This information is not intended to replace advice given to you by your health care provider. Make sure you discuss any questions you have with your health care provider. °  °Document Released: 05/15/2010 Document Revised: 05/03/2014 Document Reviewed: 10/29/2013 °Elsevier Interactive Patient Education ©2016 [employer] Inc. ° °

## 2015-12-03 NOTE — Progress Notes (Signed)
Site remains unremarkable; no c/o; discharged home with son

## 2015-12-03 NOTE — Interval H&P Note (Signed)
History and Physical Interval Note:  12/03/2015 9:14 AM  Thomas Reichmann [name]  has presented today for surgery, with the diagnosis of chronic systolic heart failure  The various methods of treatment have been discussed with the patient and family. After consideration of risks, benefits and other options for treatment, the patient has consented to  Procedure(s): Right/Left Heart Cath and Coronary Angiography (N/A) as a surgical intervention .  The patient's history has been reviewed, patient examined, no change in status, stable for surgery.  I have reviewed the patient's chart and labs.  Questions were answered to the patient's satisfaction.     Collier Salina [facility] 12/03/2015 9:14 AM

## 2015-12-03 NOTE — H&P (View-Only) (Signed)
Clinical Summary Thomas Costa is a 82 y.o.male seen today for follow up of the following medical problems.   1. Chronic systolic HF - admit [id number] with CHF to [facility], appears to be new diagnosis -09/2015 echo [facility] LVEF 20-25%, moderate AI, modertate to severe MR,  - clinic note from 2013 mentions LVEF 55-60%, moderate MR.  - EKG LBBB of unknown duration.   - occasional LE edema but overall controlled with diuretics. . DOE at 1 block which is chronic    2. Afib - long history of afib according to notes - recent afib with RVR during 09/2015 admission at [facility] - previous hematemsis when on anticoagulant in the past, has not been on  - mention of sick sinus syndrome in the past, the specificis are not well described.   - has some occasional palpitations, overall infrequent and mild.    4. 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   6 Mitral regurgitation - long history of MR followed by previous cardiology - by echo 09/2015 reported moderate to severe   7. 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 Unclear if missed dilated portion.  - CT PE [facility] 09/2015 with mildy dilated ascedning aorta at 4.2 cm.  Past Medical History:  Diagnosis Date  . Atrial fibrillation, permanent ([place])   . Chronic anemia   . Chronic leukopenia   . Congestive heart failure (CHF) ([place])   . Diabetes ([place])   . GERD (gastroesophageal reflux disease)   . Hyperlipidemia   . Hypertension   . Pancytopenia ([place])      No Known Allergies   Current Outpatient Prescriptions  Medication Sig Dispense Refill  . allopurinol (ZYLOPRIM) 300 MG tablet Take 300 mg by mouth daily.     [name] aspirin EC 81 MG tablet Take 81 mg by mouth daily.    [name] docusate sodium (COLACE)  100 MG capsule Take 100-200 mg by mouth daily as needed for mild constipation.    . folic acid (FOLVITE) 1 MG tablet Take 1 mg by mouth daily.     . furosemide (LASIX) 20 MG tablet Take 1 tablet (20 mg total) by mouth daily. 30 tablet 6  . HYDROcodone-acetaminophen (NORCO/VICODIN) 5-325 MG tablet Take 1 tablet by mouth every 6 (six) hours as needed.     . magnesium oxide (MAG-OX) 400 MG tablet Take 400 mg by mouth as needed.    . metoprolol succinate (TOPROL-XL) 50 MG 24 hr tablet Take 1 tablet (50 mg total) by mouth daily. Take with or immediately following a meal. 90 tablet 3  . omeprazole (PRILOSEC) 40 MG capsule Take 40 mg by mouth daily.      No current facility-administered medications for this visit.    Facility-Administered Medications Ordered in Other Visits  Medication Dose Route Frequency Provider Last Rate Last Dose  . fludeoxyglucose F - 18 (FDG) injection 8.5 millicurie  8.5 millicurie Intravenous Once PRN [employer] MD         Past Surgical History:  Procedure Laterality Date  . CARCINOID TUMOR RESECTION  06/2015   carcinoid cancer of bowel s/p removal with significant mesentary metastasis     No Known Allergies    Family History  Problem Relation Age of Onset  . Family  history unknown: Yes     Social History Mr. Simpkins reports that he quit smoking about 20 years ago. His smoking use included Cigarettes. He started smoking about 66 years ago. He has a 23.00 pack-year smoking history. He has never used smokeless tobacco. Mr. Raska has no alcohol history on file.   Review of Systems CONSTITUTIONAL: No weight loss, fever, chills, weakness or fatigue.  HEENT: Eyes: No visual loss, blurred vision, double vision or yellow sclerae.No hearing loss, sneezing, congestion, runny nose or sore throat.  SKIN: No rash or itching.  CARDIOVASCULAR: per HPI RESPIRATORY: No shortness of breath, cough or sputum.  GASTROINTESTINAL: No anorexia, nausea, vomiting or  diarrhea. No abdominal pain or blood.  GENITOURINARY: No burning on urination, no polyuria NEUROLOGICAL: No headache, dizziness, syncope, paralysis, ataxia, numbness or tingling in the extremities. No change in bowel or bladder control.  MUSCULOSKELETAL: No muscle, back pain, joint pain or stiffness.  LYMPHATICS: No enlarged nodes. No history of splenectomy.  PSYCHIATRIC: No history of depression or anxiety.  ENDOCRINOLOGIC: No reports of sweating, cold or heat intolerance. No polyuria or polydipsia.  [name]   Physical Examination Vitals:   11/28/15 1523  BP: 127/80  Pulse: 92   Vitals:   11/28/15 1523  Weight: 158 lb (71.7 kg)  Height: 5\' 11"  (1.803 m)    Gen: resting comfortably, no acute distress HEENT: no scleral icterus, pupils equal round and reactive, no palptable cervical adenopathy,  CV: RRR, no m/r/g, no jvd Resp: Clear to auscultation bilaterally GI: abdomen is soft, non-tender, non-distended, normal bowel sounds, no hepatosplenomegaly MSK: extremities are warm, no edema.  Skin: warm, no rash Neuro:  no focal deficits Psych: appropriate affect      Assessment and Plan  1. Chronic systolic HF - new diagnosis during 09/2015 admission. Potential etiologies include ischemic, tachycardia mediated, MR related - current status of cancer diagnosis is unclear, he has a new mediastinal mass that is being worked up.  - we are asked to assess his cardiac risk prior to CT surgery biopsy or resection under general anesthesia - we will plan for diagnostic  LHC/RHC to further evaluate his dysfunction and risk stratify for surgery, avoid any intervention at this time due to neccesity for upcoming chest surgery. He has not had any acute cardiac symptoms.  - start entresto 24/26 bid.    2. Mitral regurgitation - moderate to severe by echo - continue to monitor at this time.   3. PAF - rate controlled. No anticoag due to previous severe GI bleed when on coumadin in the past. It  looks like this may had been prior to the diagnosis of his colon mass that has since been removed, may consider repeat trial of anticoag in the future.   4. Mediastinal mass - concerning for malignancy, he is being considered for biopsy or resection by CT surgery.   5. Aortic aneurysm - remote study seen in the [facility] records from 2010. More recent imaging CT PE 09/2015 shows ascending aorta at 4.2 cm. Continue to monitor.    F/u 3-4 weeks  I have reviewed the risks, indications, and alternatives to cardiac catheterization, possible angioplasty, and stenting with the patient and his son today. Risks include but are not limited to bleeding, infection, vascular injury, stroke, myocardial infection, arrhythmia, kidney injury, radiation-related injury in the case of prolonged fluoroscopy use, emergency cardiac surgery, and death. The patient understands the risks of serious complication is 1-2 in [id number] with diagnostic cardiac cath and 1-2% or  less with angioplasty/stenting.        Arnoldo Lenis, M.D.

## 2015-12-03 NOTE — Progress Notes (Signed)
All air removed from TR Band; site observed; unremarkable; 2x2 +Tegaderm applied

## 2015-12-03 NOTE — Telephone Encounter (Signed)
PA Approval for Entresto 24/26 mg through 12/02/2017. [place] ID [id number]

## 2015-12-03 NOTE — Progress Notes (Addendum)
Sheath pulled from right AC site at 1025. Pressure held X 5 min.  Pressure dressing applied with 2X2 and hypofix. Site clean, dry and intact

## 2015-12-09 ENCOUNTER — Encounter: Payer: Medicare FFS | Admitting: Thoracic Surgery (Cardiothoracic Vascular Surgery)

## 2015-12-12 ENCOUNTER — Ambulatory Visit: Payer: Medicare FFS | Admitting: Hematology

## 2015-12-18 ENCOUNTER — Ambulatory Visit (HOSPITAL_BASED_OUTPATIENT_CLINIC_OR_DEPARTMENT_OTHER): Payer: Medicare FFS | Admitting: Hematology

## 2015-12-18 ENCOUNTER — Encounter: Payer: Self-pay | Admitting: Hematology

## 2015-12-18 VITALS — BP 112/74 | HR 77 | Temp 97.6°F | Resp 18 | Ht 71.0 in | Wt 160.7 lb

## 2015-12-18 DIAGNOSIS — D3A019 Benign carcinoid tumor of the small intestine, unspecified portion: Secondary | ICD-10-CM

## 2015-12-18 DIAGNOSIS — R590 Localized enlarged lymph nodes: Secondary | ICD-10-CM | POA: Diagnosis not present

## 2015-12-18 DIAGNOSIS — J9859 Other diseases of mediastinum, not elsewhere classified: Secondary | ICD-10-CM

## 2015-12-18 DIAGNOSIS — C7A019 Malignant carcinoid tumor of the small intestine, unspecified portion: Secondary | ICD-10-CM

## 2015-12-21 NOTE — Progress Notes (Signed)
[name]    HEMATOLOGY/ONCOLOGY CLINIC NOTE  Date of Service: .12/18/2015  Patient Care Team: Lonia Mad, MD as PCP - General (Internal Medicine)  Surgeon Dr. Valentino Saxon  Cardiology : Dr.Branch  Son - Jsutin Bieker -[phone]    CHIEF COMPLAINTS/PURPOSE OF CONSULTATION:  Abdominal Carcinoid tumor New mediastinal mass.  HISTORY OF PRESENTING ILLNESS:   Thomas Costa. is a wonderful 82 y.o. male who has been referred to [place] by Dr Tressie Stalker for continued care for his abdominal carcinoid .   patient presented in early March 2017 with abdominal pain, 30 pound weight loss over 6 months and increasing indigestion and abdominal pain. He also had nausea and vomiting. Patient was admitted to the hospital and had a CT of the abdomen that showed possible tumor in the mesentery. He was taken to surgery by Dr. Anthony Sar on 06/27/2015 and had resection of all visible tumor. He had partial small bowel resection a lymph node biopsy and he was found to have a tumor implant in the mesenteric tissue. The primary was 1.1 x 0.5-0.4 cm, he had one lymph node found to be involved and he had a 2.0 cm tumoral implant in the mesenteric tissue which was apparently resected. Patient had no symptoms of carcinoid syndrome.  He was seen in clinic on 07/21/2015 by Dr. Tressie Stalker at [facility] in [place], [place] and baseline labs were done at the time. CBC was noted to be normal with no anemia. CMP was within normal limits with no liver function abnormalities.  The actual pathology report and the baseline lab results were not available to [place].  Patient notes he healed well from his abdominal surgery. He has been taking in short and his oral appetite has improved and he feels that he is gaining back some weight. He notes that at its lowest his weight was 135 pounds and he is back up to 157.4 pounds now. Patient reports that his good baseline is 170 pounds.  Patient has significant systolic CHF and follows up with cardiology he  had a recent echocardiogram 10/20/2015 that showed left ventricular chamber size is moderately dilated, moderate concentric LVH with severely reduced left ventricular systolic function with an estimated ejection fraction of 20-25% . Also noted to have moderate to severe mitral regurgitation and moderate aortic regurgitation.  He presented to [facility] with acute dyspnea on 10/20/2015 and had a CTA of the chest with contrast that was negative for pulmonary embolism but an anterior mediastinal mass measuring 4.4 x 6.5 x 5.4 cm was noted with a few mildly prominent mediastinal lymph nodes measuring 1.5 cm and a mildly prominent right hilar lymph node. Considerations were lymphoma versus teratoma versus thymoma. Liver lesions were also apparently noted. No significant skeletal lesions noted. Bilateral pleural effusions right greater than left.  Prior to this the patient had a CT of the abdomen and pelvis with contrast on 10/18/2015 at [facility] for evaluation of generalized abdominal pain intermittently for the last 3 months. This showed postoperative changes in the abdomen with evidence of a partial small bowel resection and removal of the mediastinal mass. Small lymph nodes in the mesentery. Numerous low-density structures in the liver which probably represent cysts. Index lesion in the posterior left hepatic lobe measures 2.4 cm and stable. Noted to have gallbladder distention with some new pericholecystic fluid.  The CT scan result was provided to [place] after the patient's clinical visit. Images were requested and disc.   INTERVAL HISTORY  Thomas Costa is here for  follow-up of his mediastinal mass and abdominal neuroendocrine tumor along with his son. He was seen by Dr. Skipper Cliche from cardiothoracic surgery for his mediastinal mass and is awaiting a cardiology clearance for any kind of surgical biopsy procedure. PET/CT scan was done  which showed a 4.2 x 6.6 cm hypermetabolic anterior mediastinal  mass concerning for lymphoma versus thymoma along with 1 cm hypermetabolic right axillary node no other additional mediastinal nodes were noted to be hypermetabolic.  No new abdominal symptoms. Patient notes he is eating well. No chest pain or shortness of breath at this time. No fevers/chills/night sweats/diaphoresis or palpitations. Has follow-up with Dr. Skipper Cliche on 12/30/2015.   MEDICAL HISTORY:  Past Medical History:  Diagnosis Date  . Atrial fibrillation, permanent ([place])   . Chronic anemia   . Chronic leukopenia   . Congestive heart failure (CHF) ([place])   . Diabetes ([place])   . GERD (gastroesophageal reflux disease)   . Hyperlipidemia   . Hypertension   . 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 CARDIAC CATHETERIZATION N/A 12/03/2015   Procedure: Right/Left Heart Cath and Coronary Angiography;  Surgeon: Peter M Martinique, MD;  Location: [facility];  Service: Cardiovascular;  Laterality: N/A;  . PERIPHERAL VASCULAR CATHETERIZATION N/A 12/03/2015   Procedure: Thoracic Aortogram;  Surgeon: Peter M Martinique, MD;  Location: [facility];  Service: Cardiovascular;  Laterality: N/A;    SOCIAL HISTORY: Social History   Social History  . Marital status: Widowed    Spouse name: N/A  . Number of children: N/A  . Years of education: N/A   Occupational History  . Not on file.   Social History Main Topics  . Smoking status: Former Smoker    Packs/day: 0.50    Years: 46.00    Types: Cigarettes    Start date: 10/27/1949    Quit date: 04/27/1995  . Smokeless tobacco: Never Used  . Alcohol use Not on file  . Drug use: Unknown  . Sexual activity: Not on file   Other Topics Concern  . Not on file   Social History Narrative  . No narrative on  file    FAMILY HISTORY: Family History  Problem Relation Age of Onset  . Family history unknown: Yes  Patient denies any family history of cancers   ALLERGIES:  has No Known Allergies.  MEDICATIONS:  Current Outpatient Prescriptions  Medication Sig Dispense Refill  . allopurinol (ZYLOPRIM) 300 MG tablet Take 300 mg by mouth daily.     [name] aspirin EC 81 MG tablet Take 81 mg by mouth daily.    [name] docusate sodium (COLACE) 100 MG capsule Take 100-200 mg by mouth daily as needed for mild constipation.    . folic acid (FOLVITE) 1 MG tablet Take 1 mg by mouth daily.     . furosemide (LASIX) 20 MG tablet Take 1 tablet (20 mg total) by mouth daily. 30 tablet 6  . HYDROcodone-acetaminophen (NORCO/VICODIN) 5-325 MG tablet Take 1 tablet by mouth every 6 (six) hours as needed.     . magnesium oxide (MAG-OX) 400 MG tablet Take 400 mg by mouth 2 (two) times daily.     . metoprolol succinate (TOPROL-XL) 50 MG 24 hr tablet Take 1 tablet (50 mg total) by mouth daily.  Take with or immediately following a meal. 90 tablet 3  . omeprazole (PRILOSEC) 40 MG capsule Take 40 mg by mouth daily.     . sacubitril-valsartan (ENTRESTO) 24-26 MG Take 1 tablet by mouth 2 (two) times daily. 28 tablet 0   No current facility-administered medications for this visit.     REVIEW OF SYSTEMS:    10 Point review of Systems was done is negative except as noted above.  PHYSICAL EXAMINATION: ECOG PERFORMANCE STATUS: 2 - Symptomatic, <50% confined to bed  . Vitals:   12/18/15 1320  BP: 112/74  Pulse: 77  Resp: 18  Temp: 97.6 F (36.4 C)   Filed Weights   12/18/15 1320  Weight: 160 lb 11.2 oz (72.9 kg)   .Body mass index is 22.41 kg/m.  GENERAL:alert, in no acute distress and comfortable SKIN: skin color, texture, turgor are normal, no rashes or significant lesions EYES: normal, conjunctiva are pink and non-injected, sclera clear OROPHARYNX:no exudate, no erythema and lips, buccal mucosa, and tongue normal    NECK: supple, +JVD, thyroid normal size, non-tender, without nodularity LYMPH:  no palpable lymphadenopathy in the cervical, axillary or inguinal LUNGS: Bilateral decreased breath sounds no rales no rhonchi  HEART: Irregular rhythm, systolic murmur and mitral and aortic areas  ABDOMEN: abdomen soft, non-tender, normoactive bowel sounds healed surgical scar Musculoskeletal: Bilateral 1+ pedal edema PSYCH: alert & oriented x 3 with fluent speech NEURO: no focal motor/sensory deficits  LABORATORY DATA:  I have reviewed the data as listed  . CBC Latest Ref Rng & Units 12/03/2015 11/03/2015  WBC 4.0 - 10.5 K/uL 2.5(L) 2.4(L)  Hemoglobin 13.0 - 17.0 g/dL 12.3(L) 11.4(L)  Hematocrit 39.0 - 52.0 % 38.4(L) 36.0(L)  Platelets 150 - 400 K/uL 136(L) 143    . CMP Latest Ref Rng & Units 12/03/2015 11/03/2015  Glucose 65 - 99 mg/dL 106(H) 90  BUN 6 - 20 mg/dL 13 18.4  Creatinine 0.61 - 1.24 mg/dL 1.36(H) 1.3  Sodium 135 - 145 mmol/L 137 141  Potassium 3.5 - 5.1 mmol/L 3.2(L) 3.4(L)  Chloride 101 - 111 mmol/L 104 -  CO2 22 - 32 mmol/L 24 24  Calcium 8.9 - 10.3 mg/dL 8.6(L) 8.8  Total Protein 6.4 - 8.3 g/dL - 7.0  Total Bilirubin 0.20 - 1.20 mg/dL - 0.71  Alkaline Phos 40 - 150 U/L - 92  AST 5 - 34 U/L - 18  ALT 0 - 55 U/L - 16   .No results found for: LDH    RADIOGRAPHIC STUDIES: I have personally reviewed the radiological images as listed and agreed with the findings in the report. Nm Pet Image Initial (pi) Skull Base To Thigh  Result Date: 11/24/2015 CLINICAL DATA:  Initial treatment strategy for mediastinal mass. History of small bowel carcinoid with mesenteric nodes, status post resection. EXAM: NUCLEAR MEDICINE PET SKULL BASE TO THIGH TECHNIQUE: 8.5 mCi F-18 FDG was injected intravenously. Full-ring PET imaging was performed from the skull base to thigh after the radiotracer. CT data was obtained and used for attenuation correction and anatomic localization. FASTING BLOOD GLUCOSE:   Value: 91 mg/dl COMPARISON:  CT chest dated 10/20/2015. CT abdomen pelvis dated 10/18/2015. FINDINGS: NECK No hypermetabolic lymph nodes in the neck. 2.2 x 1.9 cm polypoid lesion along the medial wall of the left maxillary sinus (series 4/ image 13), max SUV 9.6. Given focal hypermetabolism and the polypoid appearance, this at least raises concern for a maxillary sinus polyp, although sinusitis is certainly more common. Metastatic disease to  this location would be considered unusual. CHEST 4.2 x 6.6 cm anterior mediastinal mass (series 4/image 70), max SUV 14.1. Mixed low-density anteriorly within the lesion (series 4/ image 71), but no definite fat density or calcification to suggest a germ-cell tumor. Primary differential considerations include lymphoma or thymoma. Additional small mediastinal lymph nodes, including an 11 mm short axis low right paratracheal node (series 4/ image 69) and a 9 mm short axis AP window node (series 4/ image 64). However, these demonstrate only mild hypermetabolism, max SUV 3.8, and are therefore favored to be reactive. 10 mm short axis right axillary node (series 4/image 57), max SUV 7.9, raising concern for nodal metastasis despite the small size. No suspicious pulmonary nodules. Trace bilateral pleural effusions. Septation is still coarse No pneumothorax. Cardiomegaly. No pericardial effusion. Three vessel coronary atherosclerosis. Atherosclerotic calcifications of the aortic arch. ABDOMEN/PELVIS No abnormal hypermetabolic activity within the liver, pancreas, adrenal glands, or spleen. Postsurgical changes related to prior small bowel resection with resection of previous mediastinal mass. Scattered hepatic cysts and large bilateral renal cysts. Atherosclerotic calcifications the abdominal aorta and branch vessels. Prostatomegaly, with enlargement of the central gland which indents the base of the bladder. No hypermetabolic lymph nodes in the abdomen or pelvis. SKELETON No focal  hypermetabolic activity to suggest skeletal metastasis. IMPRESSION: 4.2 x 6.6 cm hypermetabolic anterior mediastinal mass. Primary differential considerations include lymphoma or thymoma. 10 mm short axis hypermetabolic right axillary node, raising the possibility of nodal metastasis. Additional mediastinal nodes do not demonstrate appreciable hypermetabolism. 2.2 x 1.9 cm hypermetabolic polypoid lesion along the medial wall of the left maxillary sinus, possibly reflecting a benign or malignant maxillary sinus polyp. Metastatic disease to this location would be considered unusual. Consider maxillofacial MRI with/without contrast for further characterization as clinically warranted. Electronically Signed   By: Julian Hy M.D.   On: 11/24/2015 17:24    ASSESSMENT & PLAN:   82 year old African-American male with multiple medical comorbidities and severe systolic CHF with  1) ?Stage III small bowel carcinoid status post resection by Dr. Anthony Sar on 06/27/2015 at [place] at [facility]. Noted to have one positive lymph node and extramural nodule as per outside report. Recent CT of the abdomen and pelvis showed no overt new local tumor. Some small mediastinal lymph nodes. The liver shows multiple lesions thought to be cysts as per radiologist but cannot be sure. Component     Latest Ref Rng & Units 11/03/2015  Serotonin, Serum     21 - 321 ng/mL 109  Chromogranin A     0 - 5 nmol/L 6 (H)   Plan -  CT chest abdomen pelvis disks requested from [facility] -loaded into our system for review  2) anterior mediastinal mass -4.4 x 6.5 x 5.4 cm was noted with a few mildly prominent mediastinal lymph nodes. Noted to FDG avid on PET/CT scan. Concerning for lymphoma. Uncertain that this represents a part of his carcinoid tumor though cannot be ruled out. Plan -has been seen by Dr. Roxan Hockey cardiothroacic surgery to determine best option for tissue biopsy. We will need core biopsy for lymphoma workup. Has f/u on  12/30/2015. -Further management as per tissue diagnosis.  -Discussed the concerns of mediastinoscopic procedure the patient and that he needs a cardiology clearance prior to this. Patient is keen to get a tissue diagnosis and figure out treatment options. -Given the patient's significant cardiac comorbidities he will need close follow-up with his cardiologist to optimize CHF management.  RTC with Dr Irene Limbo in 2  weeks 3-4 days after tissue diagnosis.  All of the patients questions were answered with apparent satisfaction. The patient knows to call the clinic with any problems, questions or concerns.  I spent 20 minutes counseling the patient face to face. The total time spent in the appointment was 25 minutes and more than 50% was on counseling and direct patient cares.    Sullivan Lone MD [place] AAHIVMS [facility] Hematology/Oncology Physician [facility]  (Office):       [phone] (Work cell):  [phone] (Fax):           [phone]

## 2015-12-22 ENCOUNTER — Other Ambulatory Visit: Payer: Self-pay | Admitting: Hematology

## 2015-12-22 ENCOUNTER — Other Ambulatory Visit: Payer: Self-pay | Admitting: *Deleted

## 2015-12-22 DIAGNOSIS — J9859 Other diseases of mediastinum, not elsewhere classified: Secondary | ICD-10-CM

## 2015-12-23 ENCOUNTER — Telehealth: Payer: Self-pay | Admitting: Thoracic Surgery (Cardiothoracic Vascular Surgery)

## 2015-12-30 ENCOUNTER — Other Ambulatory Visit: Payer: Self-pay | Admitting: Radiology

## 2015-12-30 ENCOUNTER — Encounter: Payer: Medicare FFS | Admitting: Thoracic Surgery (Cardiothoracic Vascular Surgery)

## 2015-12-31 ENCOUNTER — Encounter (HOSPITAL_COMMUNITY): Payer: Self-pay

## 2015-12-31 ENCOUNTER — Ambulatory Visit (HOSPITAL_COMMUNITY)
Admission: RE | Admit: 2015-12-31 | Discharge: 2015-12-31 | Disposition: A | Discharge status: Home / Self Care | Payer: Medicare FFS | Source: Ambulatory Visit | Attending: Hematology | Admitting: Hematology

## 2015-12-31 DIAGNOSIS — I110 Hypertensive heart disease with heart failure: Secondary | ICD-10-CM | POA: Diagnosis not present

## 2015-12-31 DIAGNOSIS — E119 Type 2 diabetes mellitus without complications: Secondary | ICD-10-CM | POA: Insufficient documentation

## 2015-12-31 DIAGNOSIS — I509 Heart failure, unspecified: Secondary | ICD-10-CM | POA: Insufficient documentation

## 2015-12-31 DIAGNOSIS — Z7982 Long term (current) use of aspirin: Secondary | ICD-10-CM | POA: Insufficient documentation

## 2015-12-31 DIAGNOSIS — Z79899 Other long term (current) drug therapy: Secondary | ICD-10-CM | POA: Diagnosis not present

## 2015-12-31 DIAGNOSIS — K219 Gastro-esophageal reflux disease without esophagitis: Secondary | ICD-10-CM | POA: Diagnosis not present

## 2015-12-31 DIAGNOSIS — E785 Hyperlipidemia, unspecified: Secondary | ICD-10-CM | POA: Insufficient documentation

## 2015-12-31 DIAGNOSIS — I482 Chronic atrial fibrillation: Secondary | ICD-10-CM | POA: Insufficient documentation

## 2015-12-31 DIAGNOSIS — J9859 Other diseases of mediastinum, not elsewhere classified: Secondary | ICD-10-CM

## 2015-12-31 DIAGNOSIS — D4989 Neoplasm of unspecified behavior of other specified sites: Secondary | ICD-10-CM | POA: Insufficient documentation

## 2015-12-31 DIAGNOSIS — Z87891 Personal history of nicotine dependence: Secondary | ICD-10-CM | POA: Diagnosis not present

## 2015-12-31 LAB — CBC WITH DIFFERENTIAL/PLATELET
Basophils Absolute: 0 10*3/uL (ref 0.0–0.1)
Basophils Relative: 1 %
EOS PCT: 4 %
Eosinophils Absolute: 0.1 10*3/uL (ref 0.0–0.7)
HCT: 36.1 % — ABNORMAL LOW (ref 39.0–52.0)
Hemoglobin: 11.4 g/dL — ABNORMAL LOW (ref 13.0–17.0)
LYMPHS ABS: 0.6 10*3/uL — AB (ref 0.7–4.0)
LYMPHS PCT: 27 %
MCH: 27.5 pg (ref 26.0–34.0)
MCHC: 31.6 g/dL (ref 30.0–36.0)
MCV: 87.2 fL (ref 78.0–100.0)
MONO ABS: 0.3 10*3/uL (ref 0.1–1.0)
Monocytes Relative: 13 %
Neutro Abs: 1.3 10*3/uL — ABNORMAL LOW (ref 1.7–7.7)
Neutrophils Relative %: 55 %
PLATELETS: 105 10*3/uL — AB (ref 150–400)
RBC: 4.14 MIL/uL — ABNORMAL LOW (ref 4.22–5.81)
RDW: 15.2 % (ref 11.5–15.5)
WBC: 2.3 10*3/uL — ABNORMAL LOW (ref 4.0–10.5)

## 2015-12-31 LAB — PROTIME-INR
INR: 1.06
Prothrombin Time: 13.8 seconds (ref 11.4–15.2)

## 2015-12-31 LAB — APTT: aPTT: 35 seconds (ref 24–36)

## 2015-12-31 MED ORDER — LIDOCAINE-EPINEPHRINE 1 %-1:100000 IJ SOLN
INTRAMUSCULAR | Status: AC
  Filled 2015-12-31: qty 1

## 2015-12-31 MED ORDER — FENTANYL CITRATE (PF) 100 MCG/2ML IJ SOLN
INTRAMUSCULAR | Status: AC
  Filled 2015-12-31: qty 2

## 2015-12-31 MED ORDER — FENTANYL CITRATE (PF) 100 MCG/2ML IJ SOLN
INTRAMUSCULAR | Status: AC | PRN
  Administered 2015-12-31 (×2): 25 ug via INTRAVENOUS
  Administered 2015-12-31: 50 ug via INTRAVENOUS

## 2015-12-31 MED ORDER — MIDAZOLAM HCL 2 MG/2ML IJ SOLN
INTRAMUSCULAR | Status: AC
Start: 2015-12-31 — End: 2015-12-31
  Filled 2015-12-31: qty 2

## 2015-12-31 MED ORDER — SODIUM CHLORIDE 0.9 % IV SOLN: INTRAVENOUS | Status: DC

## 2015-12-31 MED ORDER — MIDAZOLAM HCL 2 MG/2ML IJ SOLN
INTRAMUSCULAR | Status: AC | PRN
  Administered 2015-12-31: 0.5 mg via INTRAVENOUS
  Administered 2015-12-31: 1 mg via INTRAVENOUS
  Administered 2015-12-31: 0.5 mg via INTRAVENOUS

## 2015-12-31 NOTE — Sedation Documentation (Signed)
Pt is resting well

## 2015-12-31 NOTE — Sedation Documentation (Signed)
Patient is resting comfortably. No complaints at this time. Dr Pascal Lux at bedside for procedure.

## 2015-12-31 NOTE — Sedation Documentation (Signed)
Patient is resting comfortably. 

## 2015-12-31 NOTE — Sedation Documentation (Signed)
Pt stable at this time. Biopsy samples taken. Fluid retrieved for cytology as well

## 2015-12-31 NOTE — Procedures (Signed)
Technically successful CT guided biopsy of hypermetabolic anterior mediastinal mass  EBL: None.  No immediate post procedural complications.   Ronny Bacon, MD Pager #: [phone]

## 2015-12-31 NOTE — Sedation Documentation (Signed)
Patient is resting comfortably. Vitals stable. 

## 2015-12-31 NOTE — Sedation Documentation (Signed)
Pt tolerated procedure well. Resting now. Attempted to call report to SS, no RN available at this time. Will continue to monitor pt.

## 2015-12-31 NOTE — Discharge Instructions (Signed)

## 2015-12-31 NOTE — Sedation Documentation (Signed)
Vital signs stable. 

## 2015-12-31 NOTE — H&P (Signed)
Chief Complaint: Mediastinal lymphadenopathy  Referring Physician(s): Brunetta Genera  Supervising Physician: Sandi Mariscal  Patient Status: Outpatient  History of Present Illness: Thomas Gionfriddo. is a 82 y.o. male who presented in early March 2017 with abdominal pain, 30 pound weight loss over 6 months and increasing indigestion and abdominal pain.   He was admitted to the hospital and had a CT of the abdomen that showed possible tumor in the mesentery.   He was taken to surgery by Dr. Anthony Sar on 06/27/2015 and had resection of all visible tumor, partial small bowel resection, and a lymph node biopsy.  On 10/20/2015, he presented to [facility] with acute dyspnea.  CTA of the chest with contrast that was negative for pulmonary embolism but did reveal an anterior mediastinal mass measuring 4.4 x 6.5 x 5.4 cm.   Also noted were a few mildly prominent mediastinal lymph nodes measuring 1.5 cm and a mildly prominent right hilar lymph node.  Considerations were lymphoma versus teratoma versus thymoma.   Liver lesions were also apparently noted. No significant skeletal lesions noted. Bilateral pleural effusions right greater than left.  We are asked to perform a CT guided biopsy of mediastinal lymph node which will be sent for lymphoma protocol.  He is NPO. He does not take blood thinners and only takes aspirin for his known Afib.  Past Medical History:  Diagnosis Date  . Atrial fibrillation, permanent ([place])   . Chronic anemia   . Chronic leukopenia   . Congestive heart failure (CHF) ([place])   . Diabetes ([place])   . GERD (gastroesophageal reflux disease)   . Hyperlipidemia   . Hypertension   . Pancytopenia [facility])     Past Surgical History:  Procedure Laterality Date  . 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 CARDIAC CATHETERIZATION N/A 12/03/2015   Procedure: Right/Left Heart Cath and Coronary  Angiography;  Surgeon: Peter M Martinique, MD;  Location: [facility];  Service: Cardiovascular;  Laterality: N/A;  . PERIPHERAL VASCULAR CATHETERIZATION N/A 12/03/2015   Procedure: Thoracic Aortogram;  Surgeon: Peter M Martinique, MD;  Location: [facility];  Service: Cardiovascular;  Laterality: N/A;    Allergies: Review of patient's allergies indicates no known allergies.  Medications: Prior to Admission medications   Medication Sig Start Date End Date Taking? Authorizing Provider  allopurinol (ZYLOPRIM) 300 MG tablet Take 300 mg by mouth daily.    Yes Historical Provider, MD  aspirin EC 81 MG tablet Take 81 mg by mouth daily.   Yes Historical Provider, MD  bismuth subsalicylate (PEPTO BISMOL) 262 MG/15ML suspension Take 30 mLs by mouth every 6 (six) hours as needed for indigestion or diarrhea or loose stools.   Yes Historical Provider, MD  docusate sodium (COLACE) 100 MG capsule Take 100-200 mg by mouth daily as needed for mild constipation.   Yes Historical Provider, MD  folic acid (FOLVITE) 1 MG tablet Take 1 mg by mouth daily.    Yes Historical Provider, MD  furosemide (LASIX) 20 MG tablet Take 1 tablet (20 mg total) by mouth daily. 11/17/15  Yes Satira Sark, MD  HYDROcodone-acetaminophen (NORCO/VICODIN) 5-325 MG tablet Take 1 tablet by mouth every 6 (six) hours as needed for moderate pain.    Yes Historical Provider, MD  magnesium oxide (MAG-OX) 400 MG tablet Take 400 mg by mouth 2 (two) times daily.    Yes Historical Provider, MD  metoprolol succinate (  TOPROL-XL) 50 MG 24 hr tablet Take 1 tablet (50 mg total) by mouth daily. Take with or immediately following a meal. 11/06/15  Yes Arnoldo Lenis, MD  omeprazole (PRILOSEC) 40 MG capsule Take 40 mg by mouth daily.    Yes Historical Provider, MD  sacubitril-valsartan (ENTRESTO) 24-26 MG Take 1 tablet by mouth 2 (two) times daily. 11/28/15  Yes Arnoldo Lenis, MD     Family History  Problem Relation Age of Onset  . Family  history unknown: Yes    Social History   Social History  . Marital status: Widowed    Spouse name: N/A  . Number of children: N/A  . Years of education: N/A   Social History Main Topics  . Smoking status: Former Smoker    Packs/day: 0.50    Years: 46.00    Types: Cigarettes    Start date: 10/27/1949    Quit date: 04/27/1995  . Smokeless tobacco: Never Used  . Alcohol use None  . Drug use: Unknown  . Sexual activity: Not Asked   Other Topics Concern  . None   Social History Narrative  . None    Review of Systems: A 12 point ROS discussed  Review of Systems  Constitutional: Positive for fatigue. Negative for activity change, appetite change, chills and fever.  HENT: Negative.   Respiratory: Negative for cough and shortness of breath.   Gastrointestinal: Positive for abdominal pain. Negative for nausea and vomiting.  Genitourinary: Negative.   Musculoskeletal: Negative.   Skin: Negative.     Vital Signs: BP 116/79   Pulse (!) 52   Temp 97.8 F (36.6 C) (Oral)   Resp 16   Ht 5\' 11"  (1.803 m)   Wt 160 lb (72.6 kg)   SpO2 100%   BMI 22.32 kg/m   Physical Exam  Constitutional: He is oriented to person, place, and time. He appears well-developed and well-nourished.  Cardiovascular:  Irregular rate (54 bpm) and rhythm due to known Afib   Pulmonary/Chest: Effort normal and breath sounds normal. He has no wheezes.  Abdominal: Soft. There is no tenderness.  Musculoskeletal: Normal range of motion.  Neurological: He is alert and oriented to person, place, and time.  Skin: Skin is warm and dry.  Psychiatric: He has a normal mood and affect. His behavior is normal. Judgment and thought content normal.  Vitals reviewed.   Mallampati Score:  MD Evaluation Airway: WNL Heart: Other (comments) Heart  comments: Irregular rate (54 bmp) and Rhythm due to known Afib Abdomen: WNL Chest/ Lungs: WNL ASA  Classification: 3 Mallampati/Airway Score: One  Imaging: No  results found.  Labs:  CBC:  Recent Labs  11/03/15 1506 12/03/15 0800  WBC 2.4* 2.5*  HGB 11.4* 12.3*  HCT 36.0* 38.4*  PLT 143 136*    COAGS:  Recent Labs  12/03/15 0800  INR 1.13    BMP:  Recent Labs  11/03/15 1506 12/03/15 0800  NA 141 137  K 3.4* 3.2*  CL  --  104  CO2 24 24  GLUCOSE 90 106*  BUN 18.4 13  CALCIUM 8.8 8.6*  CREATININE 1.3 1.36*  GFRNONAA  --  47*  GFRAA  --  55*    LIVER FUNCTION TESTS:  Recent Labs  11/03/15 1506  BILITOT 0.71  AST 18  ALT 16  ALKPHOS 92  PROT 7.0  ALBUMIN 3.8    TUMOR MARKERS: No results for input(s): AFPTM, CEA, CA199, CHROMGRNA in the last 8760 hours.  Assessment  and Plan:  History of abdominal tumor = s/p resection in March 2017  Now with mediastinal lymphadenopathy on CT  Will proceed with CT guided lymph node biopsy today by Dr. Pascal Lux.   Will send for lymphoma protocol.  Risks and Benefits discussed with the patient including, but not limited to bleeding, damage to adjacent structures or low yield requiring additional tests, hemoptysis, respiratory failure requiring intubation, infection, pneumothorax requiring chest tube placement, stroke from air embolism or even death.  All of the patient's questions were answered, patient is agreeable to proceed. Consent signed and in chart.  Thank you for this interesting consult.  I greatly enjoyed meeting Thomas Costa. and look forward to participating in their care.  A copy of this report was sent to the requesting provider on this date.  Electronically Signed: Murrell Redden PA-C 12/31/2015, 9:38 AM   I spent a total of  30 Minutes  in face to face in clinical consultation, greater than 50% of which was counseling/coordinating care for CT guided mediastinal lymph node biopsy      ]

## 2016-01-01 ENCOUNTER — Other Ambulatory Visit (HOSPITAL_COMMUNITY): Payer: Self-pay | Admitting: *Deleted

## 2016-01-01 ENCOUNTER — Ambulatory Visit: Payer: Medicare FFS | Admitting: Hematology

## 2016-01-01 ENCOUNTER — Other Ambulatory Visit: Payer: Medicare FFS

## 2016-01-01 ENCOUNTER — Encounter (HOSPITAL_COMMUNITY): Payer: Self-pay | Admitting: *Deleted

## 2016-01-01 NOTE — Progress Notes (Signed)
Spoke with patient's son to reschedule appointment for today pending tissue biopsy results. Son will call me back to reschedule, but he knows that today's appointment is cancelled.

## 2016-01-02 ENCOUNTER — Ambulatory Visit: Payer: Medicare FFS | Admitting: Cardiology

## 2016-01-05 ENCOUNTER — Telehealth: Payer: Self-pay | Admitting: Hematology

## 2016-01-05 NOTE — Telephone Encounter (Signed)
Appts conf with son, per 12/18/15 los.

## 2016-01-06 ENCOUNTER — Other Ambulatory Visit: Payer: Medicare FFS

## 2016-01-06 ENCOUNTER — Telehealth: Payer: Self-pay | Admitting: Hematology

## 2016-01-06 ENCOUNTER — Ambulatory Visit: Payer: Medicare FFS | Admitting: Hematology
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
## 2016-01-07 ENCOUNTER — Ambulatory Visit: Payer: Medicare FFS | Admitting: Cardiology

## 2016-01-19 ENCOUNTER — Ambulatory Visit (INDEPENDENT_AMBULATORY_CARE_PROVIDER_SITE_OTHER): Payer: Medicare FFS | Admitting: Cardiology

## 2016-01-19 VITALS — BP 111/73 | HR 69 | Ht 71.0 in | Wt 159.0 lb

## 2016-01-19 DIAGNOSIS — I480 Paroxysmal atrial fibrillation: Secondary | ICD-10-CM | POA: Diagnosis not present

## 2016-01-19 DIAGNOSIS — I712 Thoracic aortic aneurysm, without rupture, unspecified: Secondary | ICD-10-CM

## 2016-01-19 DIAGNOSIS — I340 Nonrheumatic mitral (valve) insufficiency: Secondary | ICD-10-CM | POA: Diagnosis not present

## 2016-01-19 DIAGNOSIS — I5022 Chronic systolic (congestive) heart failure: Secondary | ICD-10-CM | POA: Diagnosis not present

## 2016-01-19 MED ORDER — METOPROLOL SUCCINATE ER 50 MG PO TB24: 50.0000 mg | ORAL_TABLET | ORAL | Status: DC

## 2016-01-19 MED ORDER — METOPROLOL SUCCINATE ER 25 MG PO TB24: 25.0000 mg | ORAL_TABLET | Freq: Every evening | ORAL | 6 refills | Status: DC

## 2016-01-19 NOTE — Progress Notes (Signed)
Clinical Summary Thomas Costa is a 82 y.o.male seen today for follow up of the following medical problems.   1. Chronic systolic HF - admit [id number] with CHF to [facility], appears to be new diagnosis -09/2015 echo [facility] LVEF 20-25%, moderate AI, modertate to severe MR,  - clinic note from 2013 mentions LVEF 55-60%, moderate MR.  - EKG LBBB of unknown duration.  - cath 11/2015 with mild nonobstructive CAD, CI 2.2, mean PA 23, PCWP 11   - no SOB or DOE. No recent LE edema. Home weights stable around 159 lbs. Limiting sodium intake.   2. Afib - long history of afib according to notes - recent afib with RVR during 09/2015 admission at [facility] - previous hematemsis when on anticoagulant in the past, has not been on  - mention of sick sinus syndrome in the past, the specificis are not well described.   - occasional palpitations. Occurs once a week, short in duration   4. 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  - s/p CT guided biopsyy   6 Mitral regurgitation - long history of MR followed by previous cardiologist - by echo 09/2015 reported moderate to severe   7. 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 Unclear if missed dilated portion.  - CT PE [facility] 09/2015 with mildy dilated ascedning aorta at 4.2 cm.    Past Medical History:  Diagnosis Date  . Atrial fibrillation, permanent ([place])   . Chronic anemia   . Chronic leukopenia   . Congestive heart failure (CHF) ([place])   . Diabetes ([place])   . GERD (gastroesophageal reflux disease)   . Hyperlipidemia   . Hypertension   . Pancytopenia ([place])      No Known Allergies   Current Outpatient Prescriptions  Medication Sig Dispense Refill  . allopurinol (ZYLOPRIM) 300 MG tablet Take 300  mg by mouth daily.     [name] aspirin EC 81 MG tablet Take 81 mg by mouth daily.    [name] bismuth subsalicylate (PEPTO BISMOL) 262 MG/15ML suspension Take 30 mLs by mouth every 6 (six) hours as needed for indigestion or diarrhea or loose stools.    . docusate sodium (COLACE) 100 MG capsule Take 100-200 mg by mouth daily as needed for mild constipation.    . folic acid (FOLVITE) 1 MG tablet Take 1 mg by mouth daily.     . furosemide (LASIX) 20 MG tablet Take 1 tablet (20 mg total) by mouth daily. 30 tablet 6  . HYDROcodone-acetaminophen (NORCO/VICODIN) 5-325 MG tablet Take 1 tablet by mouth every 6 (six) hours as needed for moderate pain.     . magnesium oxide (MAG-OX) 400 MG tablet Take 400 mg by mouth 2 (two) times daily.     . metoprolol succinate (TOPROL-XL) 50 MG 24 hr tablet Take 1 tablet (50 mg total) by mouth daily. Take with or immediately following a meal. 90 tablet 3  . omeprazole (PRILOSEC) 40 MG capsule Take 40 mg by mouth daily.     . sacubitril-valsartan (ENTRESTO) 24-26 MG Take 1 tablet by mouth 2 (two) times daily. 28 tablet 0   No current facility-administered medications for this visit.      Past Surgical History:  Procedure Laterality Date  . 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 CARDIAC CATHETERIZATION N/A 12/03/2015   Procedure: Right/Left Heart Cath and Coronary Angiography;  Surgeon: Peter M Martinique, MD;  Location: [place] [facility];  Service: Cardiovascular;  Laterality: N/A;  . PERIPHERAL VASCULAR CATHETERIZATION N/A 12/03/2015   Procedure: Thoracic Aortogram;  Surgeon: Peter M Martinique, MD;  Location: [facility];  Service: Cardiovascular;  Laterality: N/A;     No Known Allergies    Family History  Problem Relation Age of Onset  . Family history unknown: Yes     Social History Thomas Costa reports that he quit smoking about 20 years ago. His smoking use included Cigarettes. He started  smoking about 66 years ago. He has a 23.00 pack-year smoking history. He has never used smokeless tobacco. Thomas Costa has no alcohol history on file.   Review of Systems CONSTITUTIONAL: No weight loss, fever, chills, weakness or fatigue.  HEENT: Eyes: No visual loss, blurred vision, double vision or yellow sclerae.No hearing loss, sneezing, congestion, runny nose or sore throat.  SKIN: No rash or itching.  CARDIOVASCULAR: per hpi RESPIRATORY: No shortness of breath, cough or sputum.  GASTROINTESTINAL: No anorexia, nausea, vomiting or diarrhea. No abdominal pain or blood.  GENITOURINARY: No burning on urination, no polyuria NEUROLOGICAL: No headache, dizziness, syncope, paralysis, ataxia, numbness or tingling in the extremities. No change in bowel or bladder control.  MUSCULOSKELETAL: No muscle, back pain, joint pain or stiffness.  LYMPHATICS: No enlarged nodes. No history of splenectomy.  PSYCHIATRIC: No history of depression or anxiety.  ENDOCRINOLOGIC: No reports of sweating, cold or heat intolerance. No polyuria or polydipsia.  [name]   Physical Examination Vitals:   01/19/16 1534  BP: 111/73  Pulse: 69   Vitals:   01/19/16 1534  Weight: 159 lb (72.1 kg)  Height: 5\' 11"  (1.803 m)    Gen: resting comfortably, no acute distress HEENT: no scleral icterus, pupils equal round and reactive, no palptable cervical adenopathy,  CV: RRR, 3/6 systolic murmur apex, nno jvd Resp: Clear to auscultation bilaterally GI: abdomen is soft, non-tender, non-distended, normal bowel sounds, no hepatosplenomegaly MSK: extremities are warm, no edema.  Skin: warm, no rash Neuro:  no focal deficits Psych: appropriate affect   Diagnostic Studies 11/2015 LHC/RHC  Prox LAD to Mid LAD lesion, 20 %stenosed.  There is severe left ventricular systolic dysfunction.  The left ventricular ejection fraction is less than 25% by visual estimate.  There is mild (2+) mitral regurgitation.  LV end diastolic  pressure is normal.  LV end diastolic pressure is normal.   1. Minor nonobstructive CAD 2. Severe LV enlargement with markedly reduced LV systolic function. 3. Moderate dilation of proximal aorta. 4. Normal right heart pressures, normal LV filling pressures.  5. Mild MR Aortic Root: The aortic root displays moderate dilation. Plan: medical management.       Assessment and Plan  1. Chronic systolic HF -NICM,  new diagnosis during 09/2015 admission.  - appears euvolemic - we will increase Toprol XL to 50mg  in AM and 25mg  in PM  2. Mitral regurgitation - moderate to severe by echo - we will continue to monitor  3. PAF - rate controlled. No anticoag due to previous severe GI bleed when on coumadin in the past.  - It looks like this may had been prior to the diagnosis of his colon mass that has since been removed, may consider repeat trial of anticoag in the future. Hold  for now do to ongoing cancer workup for mediastinal mass.   4. Mediastinal mass - concerning for malignancy, he is being considered for biopsy or resection by CT surgery.   5. Aortic aneurysm - continue to monitor.     F/u 1 month         Arnoldo Lenis, M.D.

## 2016-01-19 NOTE — Patient Instructions (Signed)
Medication Instructions:   Take your 50mg  Toprol XL in the morning.  Add new prescription of 25mg  Toprol XL to the evening.   Continue all other medications.    Labwork: none  Testing/Procedures: none  Follow-Up: 1 month  Any Other Special Instructions Will Be Listed Below (If Applicable).  If you need a refill on your cardiac medications before your next appointment, please call your pharmacy.

## 2016-02-03 ENCOUNTER — Ambulatory Visit (HOSPITAL_BASED_OUTPATIENT_CLINIC_OR_DEPARTMENT_OTHER): Payer: Medicare FFS | Admitting: Hematology

## 2016-02-03 ENCOUNTER — Telehealth: Payer: Self-pay | Admitting: Hematology

## 2016-02-03 ENCOUNTER — Encounter: Payer: Self-pay | Admitting: Thoracic Surgery (Cardiothoracic Vascular Surgery)

## 2016-02-03 ENCOUNTER — Ambulatory Visit (INDEPENDENT_AMBULATORY_CARE_PROVIDER_SITE_OTHER): Payer: Medicare FFS | Admitting: Thoracic Surgery (Cardiothoracic Vascular Surgery)

## 2016-02-03 ENCOUNTER — Encounter: Payer: Self-pay | Admitting: Hematology

## 2016-02-03 ENCOUNTER — Other Ambulatory Visit (HOSPITAL_BASED_OUTPATIENT_CLINIC_OR_DEPARTMENT_OTHER): Payer: Medicare FFS

## 2016-02-03 VITALS — BP 123/76 | HR 82 | Temp 97.5°F | Resp 18 | Wt 162.8 lb

## 2016-02-03 VITALS — BP 140/90 | HR 72 | Resp 20 | Ht 71.0 in | Wt 162.8 lb

## 2016-02-03 DIAGNOSIS — D150 Benign neoplasm of thymus: Secondary | ICD-10-CM | POA: Diagnosis present

## 2016-02-03 DIAGNOSIS — E876 Hypokalemia: Secondary | ICD-10-CM

## 2016-02-03 DIAGNOSIS — J9859 Other diseases of mediastinum, not elsewhere classified: Secondary | ICD-10-CM

## 2016-02-03 DIAGNOSIS — D3A019 Benign carcinoid tumor of the small intestine, unspecified portion: Secondary | ICD-10-CM | POA: Diagnosis present

## 2016-02-03 DIAGNOSIS — D696 Thrombocytopenia, unspecified: Secondary | ICD-10-CM | POA: Diagnosis not present

## 2016-02-03 DIAGNOSIS — D4989 Neoplasm of unspecified behavior of other specified sites: Secondary | ICD-10-CM

## 2016-02-03 LAB — COMPREHENSIVE METABOLIC PANEL
ALT: 9 U/L (ref 0–55)
ANION GAP: 9 meq/L (ref 3–11)
AST: 17 U/L (ref 5–34)
Albumin: 3.6 g/dL (ref 3.5–5.0)
Alkaline Phosphatase: 84 U/L (ref 40–150)
BILIRUBIN TOTAL: 0.71 mg/dL (ref 0.20–1.20)
BUN: 14.4 mg/dL (ref 7.0–26.0)
CHLORIDE: 107 meq/L (ref 98–109)
CO2: 24 meq/L (ref 22–29)
CREATININE: 1.1 mg/dL (ref 0.7–1.3)
Calcium: 8.8 mg/dL (ref 8.4–10.4)
EGFR: 77 mL/min/{1.73_m2} — ABNORMAL LOW (ref >90)
Glucose: 99 mg/dl (ref 70–140)
Potassium: 2.8 mEq/L — CL (ref 3.5–5.1)
Sodium: 140 mEq/L (ref 136–145)
TOTAL PROTEIN: 6.9 g/dL (ref 6.4–8.3)

## 2016-02-03 LAB — CBC & DIFF AND RETIC
BASO%: 0.4 % (ref 0.0–2.0)
BASOS ABS: 0 10*3/uL (ref 0.0–0.1)
EOS%: 2.6 % (ref 0.0–7.0)
Eosinophils Absolute: 0.1 10*3/uL (ref 0.0–0.5)
HEMATOCRIT: 36.2 % — AB (ref 38.4–49.9)
HGB: 12 g/dL — ABNORMAL LOW (ref 13.0–17.1)
IMMATURE RETIC FRACT: 5.9 % (ref 3.00–10.60)
LYMPH#: 0.7 10*3/uL — AB (ref 0.9–3.3)
LYMPH%: 25.2 % (ref 14.0–49.0)
MCH: 28.2 pg (ref 27.2–33.4)
MCHC: 33.1 g/dL (ref 32.0–36.0)
MCV: 85.2 fL (ref 79.3–98.0)
MONO#: 0.3 10*3/uL (ref 0.1–0.9)
MONO%: 10.7 % (ref 0.0–14.0)
NEUT#: 1.7 10*3/uL (ref 1.5–6.5)
NEUT%: 61.1 % (ref 39.0–75.0)
PLATELETS: 99 10*3/uL — AB (ref 140–400)
RBC: 4.25 10*6/uL (ref 4.20–5.82)
RDW: 16.6 % — AB (ref 11.0–14.6)
RETIC %: 0.42 % — AB (ref 0.80–1.80)
RETIC CT ABS: 17.85 10*3/uL — AB (ref 34.80–93.90)
WBC: 2.7 10*3/uL — ABNORMAL LOW (ref 4.0–10.3)

## 2016-02-03 MED ORDER — POTASSIUM CHLORIDE 20 MEQ/15ML (10%) PO SOLN: 40.0000 meq | Freq: Once | ORAL | Status: DC

## 2016-02-03 MED ORDER — POTASSIUM CHLORIDE CRYS ER 20 MEQ PO TBCR
40.0000 meq | EXTENDED_RELEASE_TABLET | Freq: Once | ORAL | Status: AC
  Administered 2016-02-03: 40 meq via ORAL
  Filled 2016-02-03: qty 2

## 2016-02-03 MED ORDER — POTASSIUM CHLORIDE CRYS ER 20 MEQ PO TBCR: 20.0000 meq | EXTENDED_RELEASE_TABLET | Freq: Every day | ORAL | 0 refills | Status: DC

## 2016-02-03 NOTE — Progress Notes (Signed)
[address]       [place] [address]             [phone]       HPI: Mr. Christine returns today to further discuss management of his anterior mediastinal mass. He is accompanied by multiple family members.  He has an 80 year old man who I first saw back in July. 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  In June he had a CT of the chest which showed an anterior mediastinal mass. A PET CT showed a 4.2 x 6.6 cm anterior mediastinal mass was hypermetabolic with an SUV of 14. A CT-guided needle biopsy was done on 12/31/2015. 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  He really recently has been feeling well after having his small bowel resection. His family is very concerned about his ability to tolerate an operation.  Past Medical History:  Diagnosis Date  . Atrial fibrillation, permanent ([place])   . Chronic anemia   . Chronic leukopenia   . Congestive heart failure (CHF) ([employer])   . Diabetes ([place])   . GERD (gastroesophageal reflux disease)   . Hyperlipidemia   . Hypertension   . Pancytopenia ([place])     Current Outpatient Prescriptions  Medication Sig Dispense Refill  . allopurinol (ZYLOPRIM) 300 MG tablet Take 300 mg by mouth daily.     [name] aspirin EC 81 MG tablet Take 162 mg by mouth daily.     [name] bismuth subsalicylate (PEPTO BISMOL) 262 MG/15ML suspension Take 30 mLs by mouth every 6 (six) hours as needed for indigestion or diarrhea or loose stools.    . docusate sodium (COLACE) 100 MG capsule Take 100-200 mg by mouth daily as needed for mild constipation.    . folic acid (FOLVITE) 1 MG tablet Take 1 mg by mouth daily.     . furosemide (LASIX) 20 MG tablet Take 1 tablet (20 mg total) by  mouth daily. 30 tablet 6  . HYDROcodone-acetaminophen (NORCO/VICODIN) 5-325 MG tablet Take 1 tablet by mouth every 6 (six) hours as needed for moderate pain.     [name] ibuprofen (ADVIL,MOTRIN) 200 MG tablet Take 200 mg by mouth every 6 (six) hours as needed.    . magnesium oxide (MAG-OX) 400 MG tablet Take 400 mg by mouth daily.    . metoprolol succinate (TOPROL XL) 25 MG 24 hr tablet Take 1 tablet (25 mg total) by mouth every evening. 30 tablet 6  . metoprolol succinate (TOPROL-XL) 50 MG 24 hr tablet Take 1 tablet (50 mg total) by mouth every morning.    [name] omeprazole (PRILOSEC) 40 MG capsule Take 40 mg by mouth daily.     . sacubitril-valsartan (ENTRESTO) 24-26 MG Take 1 tablet by mouth 2 (two) times daily. 28 tablet 0   No current facility-administered medications for this visit.     Physical Exam BP 140/90   Pulse 72   Resp 20   Ht 5\' 11"  (1.803 m)   Wt 162 lb 12.8 oz (73.8 kg)   SpO2 97% Comment: on RA  BMI 22.71 kg/m  Elderly male in no acute distress Alert and oriented 3 with no focal neuro deficits No palpable cervical or subclavicular adenopathy Cardiac regular rate and rhythm, 2/6 systolic murmur, positive S3 Lungs  diminished but otherwise clear bilaterally  Diagnostic Tests: NUCLEAR MEDICINE PET SKULL BASE TO THIGH  TECHNIQUE: 8.5 mCi F-18 FDG was injected intravenously. Full-ring PET imaging was performed from the skull base to thigh after the radiotracer. CT data was obtained and used for attenuation correction and anatomic localization.  FASTING BLOOD GLUCOSE:  Value: 91 mg/dl  COMPARISON:  CT chest dated 10/20/2015. CT abdomen pelvis dated 10/18/2015.  FINDINGS: NECK  No hypermetabolic lymph nodes in the neck.  2.2 x 1.9 cm polypoid lesion along the medial wall of the left maxillary sinus (series 4/ image 13), max SUV 9.6. Given focal hypermetabolism and the polypoid appearance, this at least raises concern for a maxillary sinus polyp, although  sinusitis is certainly more common. Metastatic disease to this location would be considered unusual.  CHEST  4.2 x 6.6 cm anterior mediastinal mass (series 4/image 70), max SUV 14.1. Mixed low-density anteriorly within the lesion (series 4/ image 71), but no definite fat density or calcification to suggest a germ-cell tumor. Primary differential considerations include lymphoma or thymoma.  Additional small mediastinal lymph nodes, including an 11 mm short axis low right paratracheal node (series 4/ image 69) and a 9 mm short axis AP window node (series 4/ image 64). However, these demonstrate only mild hypermetabolism, max SUV 3.8, and are therefore favored to be reactive.  10 mm short axis right axillary node (series 4/image 57), max SUV 7.9, raising concern for nodal metastasis despite the small size.  No suspicious pulmonary nodules. Trace bilateral pleural effusions. Septation is still coarse No pneumothorax.  Cardiomegaly. No pericardial effusion. Three vessel coronary atherosclerosis. Atherosclerotic calcifications of the aortic arch.  ABDOMEN/PELVIS  No abnormal hypermetabolic activity within the liver, pancreas, adrenal glands, or spleen.  Postsurgical changes related to prior small bowel resection with resection of previous mediastinal mass.  Scattered hepatic cysts and large bilateral renal cysts. Atherosclerotic calcifications the abdominal aorta and branch vessels. Prostatomegaly, with enlargement of the central gland which indents the base of the bladder.  No hypermetabolic lymph nodes in the abdomen or pelvis.  SKELETON  No focal hypermetabolic activity to suggest skeletal metastasis.  IMPRESSION: 4.2 x 6.6 cm hypermetabolic anterior mediastinal mass. Primary differential considerations include lymphoma or thymoma.  10 mm short axis hypermetabolic right axillary node, raising the possibility of nodal metastasis. Additional mediastinal  nodes do not demonstrate appreciable hypermetabolism.  2.2 x 1.9 cm hypermetabolic polypoid lesion along the medial wall of the left maxillary sinus, possibly reflecting a benign or malignant maxillary sinus polyp. Metastatic disease to this location would be considered unusual. Consider maxillofacial MRI with/without contrast for further characterization as clinically warranted.   Electronically Signed   By: Julian Hy M.D.   On: 11/24/2015 17:24  I personally reviewed the CT chest and PET/CT and concur with the findings noted above.  Impression: I had a long discussion with Mr. [place] and his family. It was mostly with the family as Mr. Venegas himself is relatively quiet. I reviewed the films with them and discussed the pathology findings. I explained to them that it was impossible to tell from needle biopsy whether this was a benign or malignant thymoma. His son had multiple questions about the rate of growth or tumor. It has not been a dramatic change over the past 3 months but we have no idea before that, so that question really isn't possible to answer. I did tell them that with a SUV of 14 in all likelihood this is growing at a  significant rate. Without knowing whether is benign or malignant, it is difficult to give him any kind of prognosis as to when it might cause him problems. They understand that that is totally unpredictable and it could be very short-term that problem specifically related to the thymoma could arise. We did discuss the general nature of the operation. This would require median sternotomy to do the thymectomy. He would be at significantly increased risk for perioperative morbidity and mortality due to his age and cardiomyopathy. I do not think the risk is prohibitive, but cannot rule out the possibility of significant, complications that could potentially change his quality of life.  My final recommendation to them as I thought that going ahead with surgery  was probably the best option. But, I do understand their reasoning for not wanting to do so.  They do understand there is no definite safe waiting interval. They also understand that by the time symptoms develop it may be too late to intervene surgically.  I did offer to arrange a radiation oncology appointment with them to see if that would be an option. They declined that at this time.  I did recommend that if they were not going to proceed with resection that we should repeat his scan in about 3 months.  He has an appointment with Dr. Irene Limbo this afternoon.  Plan:  Return in 3 months with CT chest  I spent 25 minutes with Mr. Laflash and his family during this visit. Melrose Nakayama, MD [facility] [phone]

## 2016-02-03 NOTE — Telephone Encounter (Signed)
Gave relative avs report and appointments for January

## 2016-02-04 LAB — CHROMOGRANIN A: CHROMOGRAN A: 9 nmol/L — AB (ref 0–5)

## 2016-02-06 LAB — SEROTONIN SERUM: Serotonin, Serum: 138 ng/mL (ref 21–321)

## 2016-02-09 NOTE — Progress Notes (Signed)
[name]    HEMATOLOGY/ONCOLOGY CLINIC NOTE  Date of Service: .02/03/2016  Patient Care Team: Lonia Mad, MD as PCP - General (Internal Medicine)  Surgeon Dr. Valentino Saxon  Cardiology : Dr.Branch  Son - Thomas Costa -[phone]    CHIEF COMPLAINTS/PURPOSE OF CONSULTATION:  Abdominal Carcinoid tumor Newly diagnosed thymoma  HISTORY OF PRESENTING ILLNESS:   Thomas Costa. is a wonderful 82 y.o. male who has been referred to [place] by Dr Tressie Stalker for continued care for his abdominal carcinoid .   patient presented in early March 2017 with abdominal pain, 30 pound weight loss over 6 months and increasing indigestion and abdominal pain. He also had nausea and vomiting. Patient was admitted to the hospital and had a CT of the abdomen that showed possible tumor in the mesentery. He was taken to surgery by Dr. Anthony Sar on 06/27/2015 and had resection of all visible tumor. He had partial small bowel resection a lymph node biopsy and he was found to have a tumor implant in the mesenteric tissue. The primary was 1.1 x 0.5-0.4 cm, he had one lymph node found to be involved and he had a 2.0 cm tumoral implant in the mesenteric tissue which was apparently resected. Patient had no symptoms of carcinoid syndrome.  He was seen in clinic on 07/21/2015 by Dr. Tressie Stalker at [facility] in [place], [place] and baseline labs were done at the time. CBC was noted to be normal with no anemia. CMP was within normal limits with no liver function abnormalities.  The actual pathology report and the baseline lab results were not available to [place].  Patient notes he healed well from his abdominal surgery. He has been taking in short and his oral appetite has improved and he feels that he is gaining back some weight. He notes that at its lowest his weight was 135 pounds and he is back up to 157.4 pounds now. Patient reports that his good baseline is 170 pounds.  Patient has significant systolic CHF and follows up with cardiology he  had a recent echocardiogram 10/20/2015 that showed left ventricular chamber size is moderately dilated, moderate concentric LVH with severely reduced left ventricular systolic function with an estimated ejection fraction of 20-25% . Also noted to have moderate to severe mitral regurgitation and moderate aortic regurgitation.  He presented to [facility] with acute dyspnea on 10/20/2015 and had a CTA of the chest with contrast that was negative for pulmonary embolism but an anterior mediastinal mass measuring 4.4 x 6.5 x 5.4 cm was noted with a few mildly prominent mediastinal lymph nodes measuring 1.5 cm and a mildly prominent right hilar lymph node. Considerations were lymphoma versus teratoma versus thymoma. Liver lesions were also apparently noted. No significant skeletal lesions noted. Bilateral pleural effusions right greater than left.  Prior to this the patient had a CT of the abdomen and pelvis with contrast on 10/18/2015 at [facility] for evaluation of generalized abdominal pain intermittently for the last 3 months. This showed postoperative changes in the abdomen with evidence of a partial small bowel resection and removal of the mediastinal mass. Small lymph nodes in the mesentery. Numerous low-density structures in the liver which probably represent cysts. Index lesion in the posterior left hepatic lobe measures 2.4 cm and stable. Noted to have gallbladder distention with some new pericholecystic fluid.  The CT scan result was provided to [place] after the patient's clinical visit. Images were requested and disc.   INTERVAL HISTORY  Mr Thomas Costa is here for  follow-up of his newly diagnosed thymoma and h/o abdominal neuroendocrine tumor along with his son and several other family members. He was seen by Dr Roxan Hockey and thought to be a challenging candidate. Patient notes no chest pain no new SOB/DOE and is disinclined to pursue any significant interventions for his newly diagnosed thymoma  unless absolutely needed and would like to only monitor it currently. He does no appear to have any obvious associated paraneoplastic syndrome related to this. No new abdominal symptoms.  MEDICAL HISTORY:  Past Medical History:  Diagnosis Date  . Atrial fibrillation, permanent ([place])   . Chronic anemia   . Chronic leukopenia   . Congestive heart failure (CHF) ([place])   . Diabetes ([place])   . GERD (gastroesophageal reflux disease)   . Hyperlipidemia   . Hypertension   . 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 CARDIAC CATHETERIZATION N/A 12/03/2015   Procedure: Right/Left Heart Cath and Coronary Angiography;  Surgeon: Peter M Martinique, MD;  Location: [place] [facility];  Service: Cardiovascular;  Laterality: N/A;  . PERIPHERAL VASCULAR CATHETERIZATION N/A 12/03/2015   Procedure: Thoracic Aortogram;  Surgeon: Peter M Martinique, MD;  Location: [facility];  Service: Cardiovascular;  Laterality: N/A;    SOCIAL HISTORY: Social History   Social History  . Marital status: Widowed    Spouse name: N/A  . Number of children: N/A  . Years of education: N/A   Occupational History  . Not on file.   Social History Main Topics  . Smoking status: Former Smoker    Packs/day: 0.50    Years: 46.00    Types: Cigarettes    Start date: 10/27/1949    Quit date: 04/27/1995  . Smokeless tobacco: Never Used  . Alcohol use Not on file  . Drug use: Unknown  . Sexual activity: Not on file   Other Topics Concern  . Not on file   Social History Narrative  . No narrative on file    FAMILY HISTORY: Family History  Problem Relation Age of Onset  . Family history unknown: Yes  Patient denies any family history of cancers   ALLERGIES:  has No Known  Allergies.  MEDICATIONS:  Current Outpatient Prescriptions  Medication Sig Dispense Refill  . allopurinol (ZYLOPRIM) 300 MG tablet Take 300 mg by mouth daily.     [name] aspirin EC 81 MG tablet Take 162 mg by mouth daily.     [name] bismuth subsalicylate (PEPTO BISMOL) 262 MG/15ML suspension Take 30 mLs by mouth every 6 (six) hours as needed for indigestion or diarrhea or loose stools.    . docusate sodium (COLACE) 100 MG capsule Take 100-200 mg by mouth daily as needed for mild constipation.    . folic acid (FOLVITE) 1 MG tablet Take 1 mg by mouth daily.     . furosemide (LASIX) 20 MG tablet Take 1 tablet (20 mg total) by mouth daily. 30 tablet 6  . HYDROcodone-acetaminophen (NORCO/VICODIN) 5-325 MG tablet Take 1 tablet by mouth every 6 (six) hours as needed for moderate pain.     [name] ibuprofen (ADVIL,MOTRIN) 200 MG tablet Take 200 mg by mouth every 6 (six) hours as needed.    . magnesium oxide (MAG-OX) 400 MG tablet Take 400 mg by mouth daily.    [name]  metoprolol succinate (TOPROL XL) 25 MG 24 hr tablet Take 1 tablet (25 mg total) by mouth every evening. 30 tablet 6  . metoprolol succinate (TOPROL-XL) 50 MG 24 hr tablet Take 1 tablet (50 mg total) by mouth every morning.    [name] omeprazole (PRILOSEC) 40 MG capsule Take 40 mg by mouth daily.     . sacubitril-valsartan (ENTRESTO) 24-26 MG Take 1 tablet by mouth 2 (two) times daily. 28 tablet 0  . potassium chloride SA (K-DUR,KLOR-CON) 20 MEQ tablet Take 1 tablet (20 mEq total) by mouth daily. Adjust based on rechecked potassium with PCP in 1 week 7 tablet 0   No current facility-administered medications for this visit.     REVIEW OF SYSTEMS:    10 Point review of Systems was done is negative except as noted above.  PHYSICAL EXAMINATION: ECOG PERFORMANCE STATUS: 2 - Symptomatic, <50% confined to bed  . Vitals:   02/03/16 1508  BP: 123/76  Pulse: 82  Resp: 18  Temp: 97.5 F (36.4 C)   Filed Weights   02/03/16 1508  Weight: 162 lb 12.8 oz (73.8  kg)   .Body mass index is 22.71 kg/m.  GENERAL:alert, in no acute distress and comfortable SKIN: skin color, texture, turgor are normal, no rashes or significant lesions EYES: normal, conjunctiva are pink and non-injected, sclera clear OROPHARYNX:no exudate, no erythema and lips, buccal mucosa, and tongue normal  NECK: supple, +JVD, thyroid normal size, non-tender, without nodularity LYMPH:  no palpable lymphadenopathy in the cervical, axillary or inguinal LUNGS: Bilateral decreased breath sounds no rales no rhonchi  HEART: Irregular rhythm, systolic murmur and mitral and aortic areas  ABDOMEN: abdomen soft, non-tender, normoactive bowel sounds healed surgical scar Musculoskeletal: Bilateral 1+ pedal edema PSYCH: alert & oriented x 3 with fluent speech NEURO: no focal motor/sensory deficits  LABORATORY DATA:  I have reviewed the data as listed  . CBC Latest Ref Rng & Units 02/03/2016 12/31/2015 12/03/2015  WBC 4.0 - 10.3 10e3/uL 2.7(L) 2.3(L) 2.5(L)  Hemoglobin 13.0 - 17.1 g/dL 12.0(L) 11.4(L) 12.3(L)  Hematocrit 38.4 - 49.9 % 36.2(L) 36.1(L) 38.4(L)  Platelets 140 - 400 10e3/uL 99(L) 105(L) 136(L)   . CBC    Component Value Date/Time   WBC 2.7 (L) 02/03/2016 1404   WBC 2.3 (L) 12/31/2015 0845   RBC 4.25 02/03/2016 1404   RBC 4.14 (L) 12/31/2015 0845   HGB 12.0 (L) 02/03/2016 1404   HCT 36.2 (L) 02/03/2016 1404   PLT 99 (L) 02/03/2016 1404   MCV 85.2 02/03/2016 1404   MCH 28.2 02/03/2016 1404   MCH 27.5 12/31/2015 0845   MCHC 33.1 02/03/2016 1404   MCHC 31.6 12/31/2015 0845   RDW 16.6 (H) 02/03/2016 1404   LYMPHSABS 0.7 (L) 02/03/2016 1404   MONOABS 0.3 02/03/2016 1404   EOSABS 0.1 02/03/2016 1404   BASOSABS 0.0 02/03/2016 1404   . CMP Latest Ref Rng & Units 02/03/2016 12/03/2015 11/03/2015  Glucose 70 - 140 mg/dl 99 106(H) 90  BUN 7.0 - 26.0 mg/dL 14.4 13 18.4  Creatinine 0.7 - 1.3 mg/dL 1.1 1.36(H) 1.3  Sodium 136 - 145 mEq/L 140 137 141  Potassium 3.5 - 5.1 mEq/L  2.8(LL) 3.2(L) 3.4(L)  Chloride 101 - 111 mmol/L - 104 -  CO2 22 - 29 mEq/L 24 24 24   Calcium 8.4 - 10.4 mg/dL 8.8 8.6(L) 8.8  Total Protein 6.4 - 8.3 g/dL 6.9 - 7.0  Total Bilirubin 0.20 - 1.20 mg/dL 0.71 - 0.71  Alkaline Phos 40 -  150 U/L 84 - 92  AST 5 - 34 U/L 17 - 18  ALT 0 - 55 U/L 9 - 16   .    RADIOGRAPHIC STUDIES: I have personally reviewed the radiological images as listed and agreed with the findings in the report.  PET/CT 11/24/2015: IMPRESSION: 4.2 x 6.6 cm hypermetabolic anterior mediastinal mass. Primary differential considerations include lymphoma or thymoma.  10 mm short axis hypermetabolic right axillary node, raising the possibility of nodal metastasis. Additional mediastinal nodes do not demonstrate appreciable hypermetabolism.  2.2 x 1.9 cm hypermetabolic polypoid lesion along the medial wall of the left maxillary sinus, possibly reflecting a benign or malignant maxillary sinus polyp. Metastatic disease to this location would be considered unusual. Consider maxillofacial MRI with/without contrast for further characterization as clinically warranted.   Electronically Signed   By: Julian Hy M.D.   On: 11/24/2015 17:24   ASSESSMENT & PLAN:   82 year old African-American male with multiple medical comorbidities and severe systolic CHF with  1) ?Stage III small bowel carcinoid status post resection by Dr. Anthony Sar on 06/27/2015 at [place] [facility]. Noted to have one positive lymph node and extramural nodule as per outside report. Chromogranin level borderline elevated to 9 , nl serotonin. No overt evidence of recurrent neuro-endocrine tumor at this time.  2) anterior mediastinal mass -4.4 x 6.5 x 5.4 cm was noted with a few mildly prominent mediastinal lymph nodes. Noted to FDG avid on PET/CT scan. CT guided biopsy consistent with thymoma. This was incidentally noted on imaging has remained asymptomatic . No overt associated paraneoplastic  presentation at this time.  3) thrombocytopenia mild PLT 99k. No evidence of bleeding - monitor  Plan -has been seen by Dr. Roxan Hockey cardiothoracic surgery to determine option for tumor resection.  -I discussed the diagnosis, prognosis, natural history, treatment options in details with the patient and his family -at this time patient and his family after extensive discussion and given his significant cardiovascular co-morbids choose to pursue an active surveillance policy. -if the patient has symptomatic disease progression might need to re-assess for surgery vs RT +/- Ctx. RTC with Dr Irene Limbo in 3 months with rpt CT chest. -continue f/uw with PCP, cardiology  4) hypokalemia - likely  From diuretics K 2.8 -given 81meq in the clinic and addition po KCL for 1 week with recommendation to f/u with PCP to recheck K in 1 week. RTC with Dr Irene Limbo in 2 weeks 3-4 days after tissue diagnosis.  All of the patients questions were answered with apparent satisfaction. The patient knows to call the clinic with any problems, questions or concerns.  I spent 30 minutes counseling the patient face to face. The total time spent in the appointment was 40 minutes and more than 50% was on counseling and direct patient cares.    Sullivan Lone MD [place] AAHIVMS [facility] Hematology/Oncology Physician [facility]  (Office):       [phone] (Work cell):  [phone] (Fax):           [phone]

## 2016-02-10 LAB — NEURON-SPECIFIC ENOLASE(NSE), BLOOD: Neuron Specific Enolase: <5 ng/mL (ref <10.8)

## 2016-02-18 ENCOUNTER — Encounter: Payer: Self-pay | Admitting: Cardiology

## 2016-02-18 ENCOUNTER — Ambulatory Visit (INDEPENDENT_AMBULATORY_CARE_PROVIDER_SITE_OTHER): Payer: Medicare FFS | Admitting: Cardiology

## 2016-02-18 VITALS — BP 121/79 | HR 78 | Ht 71.0 in | Wt 165.8 lb

## 2016-02-18 DIAGNOSIS — I480 Paroxysmal atrial fibrillation: Secondary | ICD-10-CM

## 2016-02-18 DIAGNOSIS — I340 Nonrheumatic mitral (valve) insufficiency: Secondary | ICD-10-CM | POA: Diagnosis not present

## 2016-02-18 DIAGNOSIS — I712 Thoracic aortic aneurysm, without rupture, unspecified: Secondary | ICD-10-CM

## 2016-02-18 DIAGNOSIS — I5022 Chronic systolic (congestive) heart failure: Secondary | ICD-10-CM | POA: Diagnosis not present

## 2016-02-18 MED ORDER — METOPROLOL SUCCINATE ER 50 MG PO TB24: 50.0000 mg | ORAL_TABLET | Freq: Two times a day (BID) | ORAL | 3 refills | Status: DC

## 2016-02-18 NOTE — Progress Notes (Signed)
Clinical Summary Mr. Smathers is a 82 y.o.male seen today for follow up of the following medical problems.   1. Chronic systolic HF - admit [id number] with CHF to [facility], appears to be new diagnosis -09/2015 echo [facility] LVEF 20-25%, moderate AI, modertate to severe MR,  - clinic note from 2013 mentions LVEF 55-60%, moderate MR.  - EKG LBBB of unknown duration.  - cath 11/2015 with mild nonobstructive CAD, CI 2.2, mean PA 23, PCWP 11  - last visit we increased Topro XL to 50mg  in AM and 25mg  in PM. Tolerating without side effects - no recent LE edema. No SOB/DOE    2. Afib - long history of afib according to notes - recent afib with RVR during 09/2015 admission at [facility] - previous hematemsis when on anticoagulant in the past, has not been on  - mention of sick sinus syndrome in the past, the specificis are not well described.   - occasional palpitations. Just once since last visit, lasted a few hours.   4. 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  - s/p CT guided biopsy   6Mitral regurgitation - long history of MR followed by previous cardiologist - by echo 09/2015 reported moderate to severe   7. 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 Unclear if missed dilated portion.  - CT PE [facility] 09/2015 with mildy dilated ascedning aorta at 4.2 cm.  Past Medical History:  Diagnosis Date  . Atrial fibrillation, permanent ([place])   . Chronic anemia   . Chronic leukopenia   . Congestive heart failure (CHF) ([place])   . Diabetes ([place])   . GERD (gastroesophageal reflux disease)   . Hyperlipidemia   . Hypertension   . Pancytopenia ([place])      No Known Allergies   Current Outpatient Prescriptions  Medication Sig Dispense Refill  .  allopurinol (ZYLOPRIM) 300 MG tablet Take 300 mg by mouth daily.     [name] aspirin EC 81 MG tablet Take 162 mg by mouth daily.     [name] bismuth subsalicylate (PEPTO BISMOL) 262 MG/15ML suspension Take 30 mLs by mouth every 6 (six) hours as needed for indigestion or diarrhea or loose stools.    . docusate sodium (COLACE) 100 MG capsule Take 100-200 mg by mouth daily as needed for mild constipation.    . folic acid (FOLVITE) 1 MG tablet Take 1 mg by mouth daily.     . furosemide (LASIX) 20 MG tablet Take 1 tablet (20 mg total) by mouth daily. 30 tablet 6  . HYDROcodone-acetaminophen (NORCO/VICODIN) 5-325 MG tablet Take 1 tablet by mouth every 6 (six) hours as needed for moderate pain.     [name] ibuprofen (ADVIL,MOTRIN) 200 MG tablet Take 200 mg by mouth every 6 (six) hours as needed.    . magnesium oxide (MAG-OX) 400 MG tablet Take 400 mg by mouth daily.    . metoprolol succinate (TOPROL XL) 25 MG 24 hr tablet Take 1 tablet (25 mg total) by mouth every evening. 30 tablet 6  . metoprolol succinate (TOPROL-XL) 50 MG 24 hr tablet Take 1 tablet (50 mg total) by mouth every morning.    [name] omeprazole (PRILOSEC) 40 MG capsule Take 40 mg by mouth daily.     . potassium chloride  SA (K-DUR,KLOR-CON) 20 MEQ tablet Take 1 tablet (20 mEq total) by mouth daily. Adjust based on rechecked potassium with PCP in 1 week 7 tablet 0  . sacubitril-valsartan (ENTRESTO) 24-26 MG Take 1 tablet by mouth 2 (two) times daily. 28 tablet 0   No current facility-administered medications for this visit.      Past Surgical History:  Procedure Laterality Date  . 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 CARDIAC CATHETERIZATION N/A 12/03/2015   Procedure: Right/Left Heart Cath and Coronary Angiography;  Surgeon: Peter M Martinique, MD;  Location: [place] [facility];  Service: Cardiovascular;  Laterality: N/A;  . PERIPHERAL VASCULAR CATHETERIZATION N/A 12/03/2015    Procedure: Thoracic Aortogram;  Surgeon: Peter M Martinique, MD;  Location: [place] [facility];  Service: Cardiovascular;  Laterality: N/A;     No Known Allergies    Family History  Problem Relation Age of Onset  . Family history unknown: Yes     Social History Mr. Hagge reports that he quit smoking about 20 years ago. His smoking use included Cigarettes. He started smoking about 66 years ago. He has a 23.00 pack-year smoking history. He has never used smokeless tobacco. Mr. Ekis has no alcohol history on file.   Review of Systems CONSTITUTIONAL: No weight loss, fever, chills, weakness or fatigue.  HEENT: Eyes: No visual loss, blurred vision, double vision or yellow sclerae.No hearing loss, sneezing, congestion, runny nose or sore throat.  SKIN: No rash or itching.  CARDIOVASCULAR: per hpi RESPIRATORY: No shortness of breath, cough or sputum.  GASTROINTESTINAL: No anorexia, nausea, vomiting or diarrhea. No abdominal pain or blood.  GENITOURINARY: No burning on urination, no polyuria NEUROLOGICAL: No headache, dizziness, syncope, paralysis, ataxia, numbness or tingling in the extremities. No change in bowel or bladder control.  MUSCULOSKELETAL: No muscle, back pain, joint pain or stiffness.  LYMPHATICS: No enlarged nodes. No history of splenectomy.  PSYCHIATRIC: No history of depression or anxiety.  ENDOCRINOLOGIC: No reports of sweating, cold or heat intolerance. No polyuria or polydipsia.  [name]   Physical Examination Vitals:   02/18/16 1425  BP: 121/79  Pulse: 78   Vitals:   02/18/16 1425  Weight: 165 lb 12.8 oz (75.2 kg)  Height: 5\' 11"  (1.803 m)    Gen: resting comfortably, no acute distress HEENT: no scleral icterus, pupils equal round and reactive, no palptable cervical adenopathy,  CV: RRR, no mrg, no jvd Resp: Clear to auscultation bilaterally GI: abdomen is soft, non-tender, non-distended, normal bowel sounds, no hepatosplenomegaly MSK: extremities are warm, no  edema.  Skin: warm, no rash Neuro:  no focal deficits Psych: appropriate affect   Diagnostic Studies 11/2015 LHC/RHC  Prox LAD to Mid LAD lesion, 20 %stenosed.  There is severe left ventricular systolic dysfunction.  The left ventricular ejection fraction is less than 25% by visual estimate.  There is mild (2+) mitral regurgitation.  LV end diastolic pressure is normal.  LV end diastolic pressure is normal.  1. Minor nonobstructive CAD 2. Severe LV enlargement with markedly reduced LV systolic function. 3. Moderate dilation of proximal aorta. 4. Normal right heart pressures, normal LV filling pressures.  5. Mild MR Aortic Root: The aortic root displays moderate dilation. Plan: medical management.     Assessment and Plan  1. Chronic systolic HF -NICM,  new diagnosis during 09/2015 admission.  - no current symptoms - we will increase Toprol XL to 50mg  in AM and 50mg   in PM  2. Mitral regurgitation - moderate to severe by echo - we will continue to monitor at this time  3. PAF - rate controlled. No anticoag due to previous severe GI bleed when on coumadin in the past.  - It looks like this may had been prior to the diagnosis of his colon mass that has since been removed, may consider repeat trial of anticoag in the future   4. Mediastinal mass - concerning for malignancy, he is being considered for biopsy or resection by CT surgery.   5. Aortic aneurysm - we will continue to monitor.        Arnoldo Lenis, M.D.

## 2016-02-18 NOTE — Patient Instructions (Signed)
Medication Instructions:   Your physician has recommended you make the following change in your medication:   Increase toprol xl to 50 mg twice daily.  Continue all other medications will remain the same.  Labwork: NONE  Testing/Procedures: NONE  Follow-Up:  Your physician recommends that you schedule a follow-up appointment in: 1 month.  Any Other Special Instructions Will Be Listed Below (If Applicable).  If you need a refill on your cardiac medications before your next appointment, please call your pharmacy.

## 2016-03-16 ENCOUNTER — Ambulatory Visit (INDEPENDENT_AMBULATORY_CARE_PROVIDER_SITE_OTHER): Payer: Medicare FFS | Admitting: Cardiology

## 2016-03-16 ENCOUNTER — Encounter: Payer: Self-pay | Admitting: Cardiology

## 2016-03-16 VITALS — BP 115/72 | HR 84 | Ht 71.0 in | Wt 166.8 lb

## 2016-03-16 DIAGNOSIS — I480 Paroxysmal atrial fibrillation: Secondary | ICD-10-CM

## 2016-03-16 DIAGNOSIS — I5022 Chronic systolic (congestive) heart failure: Secondary | ICD-10-CM

## 2016-03-16 DIAGNOSIS — I340 Nonrheumatic mitral (valve) insufficiency: Secondary | ICD-10-CM | POA: Diagnosis not present

## 2016-03-16 MED ORDER — METOPROLOL SUCCINATE ER 25 MG PO TB24: 75.0000 mg | ORAL_TABLET | Freq: Every day | ORAL | 1 refills | Status: DC

## 2016-03-16 NOTE — Patient Instructions (Signed)
Your physician recommends that you schedule a follow-up appointment in: [place] DR. BRAN[facility]   Your physician has recommended you make the following change in your medication:   INCREASE TOPROL XL 75 MG DAILY   Thank you for choosing [facility]!!

## 2016-03-16 NOTE — Progress Notes (Signed)
Clinical Summary Thomas Costa is a 82 y.o.male seen today for f/u of the following medical problems.   1. Chronic systolic HF - admit Q000111Q with CHF to [facility], appears to be new diagnosis -09/2015 echo [facility] LVEF 20-25%, moderate AI, modertate to severe MR,  - clinic note from 2013 mentions LVEF 55-60%, moderate MR.  - EKG LBBB of unknown duration.  - cath 11/2015 with mild nonobstructive CAD, CI 2.2, mean PA 23, PCWP 11   - last visit we increased Toprol XL to 50mg  bid. Tolerated well without side effects - no recent SOB/DOE, no LE edema  2. Afib - long history of afib according to notes - recent afib with RVR during 09/2015 admission at [facility] - previous hematemsis when on anticoagulant in the past, has not been on  - mention of sick sinus syndrome in the past, the specificis are not well described.   - no recent palpitaoitns.    4. 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  - s/p CT guided biopsy   6Mitral regurgitation - long history of MR followed by previous cardiologist - by echo 09/2015 reported moderate to severe   7. 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 Unclear if missed dilated portion.  - CT PE [facility] 09/2015 with mildy dilated ascedning aorta at 4.2 cm.  Past Medical History:  Diagnosis Date  . Atrial fibrillation, permanent (Thomas Costa)   . Chronic anemia   . Chronic leukopenia   . Congestive heart failure (CHF) ([place])   . Diabetes ([place])   . GERD (gastroesophageal reflux disease)   . Hyperlipidemia   . Hypertension   . Pancytopenia ([place])      No Known Allergies   Current Outpatient Prescriptions  Medication Sig Dispense Refill  . allopurinol (ZYLOPRIM) 300 MG tablet Take 300 mg by mouth daily.     [name]  aspirin EC 81 MG tablet Take 162 mg by mouth daily.     [name] docusate sodium (COLACE) 100 MG capsule Take 100-200 mg by mouth daily as needed for mild constipation.    . folic acid (FOLVITE) 1 MG tablet Take 1 mg by mouth daily.     . furosemide (LASIX) 20 MG tablet Take 1 tablet (20 mg total) by mouth daily. 30 tablet 6  . HYDROcodone-acetaminophen (NORCO/VICODIN) 5-325 MG tablet Take 1 tablet by mouth every 6 (six) hours as needed for moderate pain.     [name] ibuprofen (ADVIL,MOTRIN) 200 MG tablet Take 200 mg by mouth every 6 (six) hours as needed.    . magnesium oxide (MAG-OX) 400 MG tablet Take 400 mg by mouth daily.    . metoprolol succinate (TOPROL-XL) 50 MG 24 hr tablet Take 1 tablet (50 mg total) by mouth 2 (two) times daily. 60 tablet 3  . omeprazole (PRILOSEC) 40 MG capsule Take 40 mg by mouth daily.     . potassium chloride SA (K-DUR,KLOR-CON) 20 MEQ tablet Take 1 tablet (20 mEq total) by mouth daily. Adjust based on rechecked potassium with PCP in 1 week 7 tablet 0  . sacubitril-valsartan (ENTRESTO) 24-26 MG Take 1 tablet by mouth 2 (two) times daily. 28 tablet 0   No current facility-administered medications for this visit.      Past Surgical History:  Procedure Laterality Date  . 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 CARDIAC CATHETERIZATION N/A 12/03/2015   Procedure: Right/Left Heart Cath and Coronary Angiography;  Surgeon: Thomas M Martinique, MD;  Location: [place] [facility];  Service: Cardiovascular;  Laterality: N/A;  . PERIPHERAL VASCULAR CATHETERIZATION N/A 12/03/2015   Procedure: Thoracic Aortogram;  Surgeon: Thomas M Martinique, MD;  Location: [place] [facility];  Service: Cardiovascular;  Laterality: N/A;     No Known Allergies    Family History  Problem Relation Age of Onset  . Family history unknown: Yes     Social History [name] reports that he quit smoking about 20 years ago. His smoking  use included Cigarettes. He started smoking about 66 years ago. He has a 23.00 pack-year smoking history. He has never used smokeless tobacco. [name] has no alcohol history on file.   Review of Systems CONSTITUTIONAL: No weight loss, fever, chills, weakness or fatigue.  HEENT: Eyes: No visual loss, blurred vision, double vision or yellow sclerae.No hearing loss, sneezing, congestion, runny nose or sore throat.  SKIN: No rash or itching.  CARDIOVASCULAR: per HPI RESPIRATORY: No shortness of breath, cough or sputum.  GASTROINTESTINAL: No anorexia, nausea, vomiting or diarrhea. No abdominal pain or blood.  GENITOURINARY: No burning on urination, no polyuria NEUROLOGICAL: No headache, dizziness, syncope, paralysis, ataxia, numbness or tingling in the extremities. No change in bowel or bladder control.  MUSCULOSKELETAL: No muscle, back pain, joint pain or stiffness.  LYMPHATICS: No enlarged nodes. No history of splenectomy.  PSYCHIATRIC: No history of depression or anxiety.  ENDOCRINOLOGIC: No reports of sweating, cold or heat intolerance. No polyuria or polydipsia.  [name]   Physical Examination Vitals:   03/16/16 1455  BP: 115/72  Pulse: 84   Vitals:   03/16/16 1455  Weight: 166 lb 12.8 oz (75.7 kg)  Height: 5\' 11"  (1.803 m)    Gen: resting comfortably, no acute distress HEENT: no scleral icterus, pupils equal round and reactive, no palptable cervical adenopathy,  CV Resp: Clear to auscultation bilaterally GI: abdomen is soft, non-tender, non-distended, normal bowel sounds, no hepatosplenomegaly MSK: extremities are warm, no edema.  Skin: warm, no rash Neuro:  no focal deficits Psych: appropriate affect   Diagnostic Studies 11/2015 LHC/RHC  Prox LAD to Mid LAD lesion, 20 %stenosed.  There is severe left ventricular systolic dysfunction.  The left ventricular ejection fraction is less than 25% by visual estimate.  There is mild (2+) mitral regurgitation.  LV end  diastolic pressure is normal.  LV end diastolic pressure is normal.  1. Minor nonobstructive CAD 2. Severe LV enlargement with markedly reduced LV systolic function. 3. Moderate dilation of proximal aorta. 4. Normal right heart pressures, normal LV filling pressures.  5. Mild MR Aortic Root: The aortic root displays moderate dilation. Plan: medical management.     Assessment and Plan  1. Chronic systolic HF -NICM, new diagnosis during 09/2015 admission.  - no current symptoms - we will increase Toprol XL to 75mg  bid - repeat echo in next few months  2. Mitral regurgitation - moderate to severe by echo - we will continue to monitor at this time - repeat echo in next few months  3. PAF - rate controlled. No anticoag due to previous severe GI bleed when on coumadin in the past.  - continue current meds  4. Mediastinal mass - concerning for malignancy, he is being considered for biopsy  or resection by CT surgery.   5. Aortic aneurysm - we will continue to monitor at this time   F/u 1 month   Arnoldo Lenis, M.D.

## 2016-03-23 ENCOUNTER — Telehealth: Payer: Self-pay | Admitting: *Deleted

## 2016-03-23 NOTE — Telephone Encounter (Signed)
[employer] approved recent dose change of metoprolol succ 75 mg through 03/18/17. Faxed approval to [facility] pharmacy

## 2016-04-14 ENCOUNTER — Other Ambulatory Visit: Payer: Self-pay | Admitting: Cardiology

## 2016-04-15 ENCOUNTER — Ambulatory Visit (INDEPENDENT_AMBULATORY_CARE_PROVIDER_SITE_OTHER): Payer: Medicare FFS | Admitting: Cardiology

## 2016-04-15 ENCOUNTER — Encounter: Payer: Self-pay | Admitting: Cardiology

## 2016-04-15 VITALS — BP 110/78 | HR 62 | Ht 71.0 in | Wt 162.0 lb

## 2016-04-15 DIAGNOSIS — I340 Nonrheumatic mitral (valve) insufficiency: Secondary | ICD-10-CM

## 2016-04-15 DIAGNOSIS — I10 Essential (primary) hypertension: Secondary | ICD-10-CM | POA: Diagnosis not present

## 2016-04-15 DIAGNOSIS — I480 Paroxysmal atrial fibrillation: Secondary | ICD-10-CM

## 2016-04-15 DIAGNOSIS — I5022 Chronic systolic (congestive) heart failure: Secondary | ICD-10-CM

## 2016-04-15 MED ORDER — FUROSEMIDE 20 MG PO TABS: 20.0000 mg | ORAL_TABLET | Freq: Every day | ORAL | Status: DC | PRN

## 2016-04-15 MED ORDER — METOPROLOL SUCCINATE ER 100 MG PO TB24: 100.0000 mg | ORAL_TABLET | Freq: Two times a day (BID) | ORAL | 6 refills | Status: DC

## 2016-04-15 MED ORDER — POTASSIUM CHLORIDE CRYS ER 20 MEQ PO TBCR: 20.0000 meq | EXTENDED_RELEASE_TABLET | Freq: Every day | ORAL | Status: DC | PRN

## 2016-04-15 NOTE — Patient Instructions (Signed)
Medication Instructions:   Increase Toprol XL to 100mg  twice a day.    Change Lasix to 20mg  daily as needed for swelling / edema.  Change Potassium to 74meq daily only on the days you take your Lasix.  Continue all other medications.    Labwork: BMET, Magnesium - orders given today.  Testing/Procedures: Your physician has requested that you have an echocardiogram. Echocardiography is a painless test that uses sound waves to create images of your heart. It provides your doctor with information about the size and shape of your heart and how well your heart's chambers and valves are working. This procedure takes approximately one hour. There are no restrictions for this procedure.  Follow-Up:  Office will contact with results via phone or letter.    Follow up pending test results.   Any Other Special Instructions Will Be Listed Below (If Applicable).  If you need a refill on your cardiac medications before your next appointment, please call your pharmacy.

## 2016-04-15 NOTE — Progress Notes (Signed)
Clinical Summary Thomas Costa is a 82 y.o.male seen today for f/u of the following medical problems.   1. Chronic systolic HF - admit Q000111Q with CHF to [facility], appears to be new diagnosis -09/2015 echo [facility] LVEF 20-25%, moderate AI, modertate to severe MR,  - clinic note from 2013 mentions LVEF 55-60%, moderate MR.  - EKG LBBB of unknown duration.  - cath 11/2015 with mild nonobstructive CAD, CI 2.2, mean PA 23, PCWP 11    - last visit we increased Toprol XL to 75mg  bid. Tolerated without side effects.  - no recent symptoms.    2. Afib - long history of afib according to notes - recent afib with RVR during 09/2015 admission at [facility] - previous hematemsis when on anticoagulant in the past, has not been on  - mention of sick sinus syndrome in the past, the specificis are not well described.   - no palpitatoins since last visit.     4. 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  - s/p CT guided biopsy   6Mitral regurgitation - long history of MR followed by previous cardiologist - by echo 09/2015 reported moderate to severe - no recent symptoms.    7. 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 Unclear if missed dilated portion.  - CT PE [facility] 09/2015 with mildy dilated ascedning aorta at 4.2 cm.    Past Medical History:  Diagnosis Date  . Atrial fibrillation, permanent ([place])   . Chronic anemia   . Chronic leukopenia   . Congestive heart failure (CHF) ([place])   . Diabetes ([place])   . GERD (gastroesophageal reflux disease)   . Hyperlipidemia   . Hypertension   . Pancytopenia ([place])      No Known Allergies   Current Outpatient Prescriptions  Medication Sig Dispense Refill  . allopurinol (ZYLOPRIM) 300 MG tablet Take  300 mg by mouth daily.     [name] aspirin EC 81 MG tablet Take 162 mg by mouth daily.     [name] docusate sodium (COLACE) 100 MG capsule Take 100-200 mg by mouth daily as needed for mild constipation.    [name] ENTRESTO 24-26 MG TAKE ONE TABLET BY MOUTH TWICE DAILY 60 tablet 3  . folic acid (FOLVITE) 1 MG tablet Take 1 mg by mouth daily.     . furosemide (LASIX) 20 MG tablet Take 1 tablet (20 mg total) by mouth daily. 30 tablet 6  . HYDROcodone-acetaminophen (NORCO/VICODIN) 5-325 MG tablet Take 1 tablet by mouth every 6 (six) hours as needed for moderate pain.     [name] ibuprofen (ADVIL,MOTRIN) 200 MG tablet Take 200 mg by mouth every 6 (six) hours as needed.    . metoprolol succinate (TOPROL-XL) 25 MG 24 hr tablet Take 3 tablets (75 mg total) by mouth daily. 270 tablet 1  . omeprazole (PRILOSEC) 40 MG capsule Take 40 mg by mouth daily.     . sacubitril-valsartan (ENTRESTO) 24-26 MG Take 1 tablet by mouth 2 (two) times daily. 28 tablet 0   No current facility-administered medications for this visit.      Past Surgical History:  Procedure Laterality Date  . 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 CARDIAC  CATHETERIZATION N/A 12/03/2015   Procedure: Right/Left Heart Cath and Coronary Angiography;  Surgeon: Peter M Martinique, MD;  Location: [place] [facility];  Service: Cardiovascular;  Laterality: N/A;  . PERIPHERAL VASCULAR CATHETERIZATION N/A 12/03/2015   Procedure: Thoracic Aortogram;  Surgeon: Peter M Martinique, MD;  Location: [place] [facility];  Service: Cardiovascular;  Laterality: N/A;     No Known Allergies    Family History  Problem Relation Age of Onset  . Family history unknown: Yes     Social History Thomas Costa reports that he quit smoking about 20 years ago. His smoking use included Cigarettes. He started smoking about 66 years ago. He has a 23.00 pack-year smoking history. He has never used smokeless tobacco. Thomas Costa  has no alcohol history on file.   Review of Systems CONSTITUTIONAL: No weight loss, fever, chills, weakness or fatigue.  HEENT: Eyes: No visual loss, blurred vision, double vision or yellow sclerae.No hearing loss, sneezing, congestion, runny nose or sore throat.  SKIN: No rash or itching.  CARDIOVASCULAR: per hpi RESPIRATORY: No shortness of breath, cough or sputum.  GASTROINTESTINAL: No anorexia, nausea, vomiting or diarrhea. No abdominal pain or blood.  GENITOURINARY: No burning on urination, no polyuria NEUROLOGICAL: No headache, dizziness, syncope, paralysis, ataxia, numbness or tingling in the extremities. No change in bowel or bladder control.  MUSCULOSKELETAL: No muscle, back pain, joint pain or stiffness.  LYMPHATICS: No enlarged nodes. No history of splenectomy.  PSYCHIATRIC: No history of depression or anxiety.  ENDOCRINOLOGIC: No reports of sweating, cold or heat intolerance. No polyuria or polydipsia.  [name]   Physical Examination Vitals:   04/15/16 0933  BP: 110/78  Pulse: 62   Vitals:   04/15/16 0933  Weight: 162 lb (73.5 kg)  Height: 5\' 11"  (1.803 m)    Gen: resting comfortably, no acute distress HEENT: no scleral icterus, pupils equal round and reactive, no palptable cervical adenopathy,  CV: RRR, no m/r/g, no jvd Resp: Clear to auscultation bilaterally GI: abdomen is soft, non-tender, non-distended, normal bowel sounds, no hepatosplenomegaly MSK: extremities are warm, no edema.  Skin: warm, no rash Neuro:  no focal deficits Psych: appropriate affect   Diagnostic Studies 11/2015 LHC/RHC  Prox LAD to Mid LAD lesion, 20 %stenosed.  There is severe left ventricular systolic dysfunction.  The left ventricular ejection fraction is less than 25% by visual estimate.  There is mild (2+) mitral regurgitation.  LV end diastolic pressure is normal.  LV end diastolic pressure is normal.  1. Minor nonobstructive CAD 2. Severe LV enlargement with markedly  reduced LV systolic function. 3. Moderate dilation of proximal aorta. 4. Normal right heart pressures, normal LV filling pressures.  5. Mild MR Aortic Root: The aortic root displays moderate dilation. Plan: medical management.      Assessment and Plan  1. Chronic sysotlic HF NICM, new diagnosis during 09/2015 admission.  - no current symptoms - we will increase Toprol XL to 100 mg bid. Change lasix to prn only. Repeat BMET/Mg.  - repeat echo. If LVEF remains decreased continue medication titration. If improved may affect management of his mediastinal mass by CT surgeyr and heme/onc.   2. Mitral regurgitation - moderate to severe by echo - no symptoms - repeat echo  3. PAF - rate controlled. No anticoag due to previous severe GI bleed when on coumadin in the past.  - continue current medical therapy.   4. Mediastinal mass - concerning for malignancy, he is being considered for biopsy or resection  by CT surgery.   5. Aortic aneurysm - we will continue to monitor at this time   F/u pending echo results      Arnoldo Lenis, M.D.

## 2016-04-22 ENCOUNTER — Other Ambulatory Visit: Payer: Medicare FFS

## 2016-04-29 ENCOUNTER — Ambulatory Visit (INDEPENDENT_AMBULATORY_CARE_PROVIDER_SITE_OTHER): Payer: Medicare FFS

## 2016-04-29 ENCOUNTER — Other Ambulatory Visit: Payer: Self-pay

## 2016-04-29 DIAGNOSIS — I5022 Chronic systolic (congestive) heart failure: Secondary | ICD-10-CM | POA: Diagnosis not present

## 2016-05-03 ENCOUNTER — Telehealth: Payer: Self-pay | Admitting: Hematology

## 2016-05-03 NOTE — Telephone Encounter (Signed)
Patient son called to cancel Thomas Costa's appointment on 05/05/16. He stated that Thomas Costa had a Stroke on 05/02/16 and is in [facility].

## 2016-05-05 ENCOUNTER — Ambulatory Visit (HOSPITAL_COMMUNITY): Payer: Medicare FFS

## 2016-05-05 ENCOUNTER — Other Ambulatory Visit: Payer: Medicare FFS

## 2016-05-06 ENCOUNTER — Encounter: Payer: Self-pay | Admitting: *Deleted

## 2016-05-06 ENCOUNTER — Telehealth: Payer: Self-pay | Admitting: *Deleted

## 2016-05-06 NOTE — Telephone Encounter (Signed)
Pt son aware - says pt had recent stroke ([place] will request records) f/u appt made - routed to pcp        Notes Recorded by Arnoldo Lenis, MD on 05/03/2016 at 11:05 AM EST Labs look good

## 2016-05-06 NOTE — Telephone Encounter (Signed)
-----   Message from Arnoldo Lenis, MD sent at 05/04/2016  1:00 PM EST ----- Heart function remains weak, overall unchanged from last study. We will continue current meds. F/u 6 weeks  J BrancH MD

## 2016-05-11 ENCOUNTER — Ambulatory Visit: Payer: Medicare FFS | Admitting: Hematology

## 2016-05-11 ENCOUNTER — Encounter: Payer: Medicare FFS | Admitting: Thoracic Surgery (Cardiothoracic Vascular Surgery)

## 2016-05-17 ENCOUNTER — Encounter: Payer: Self-pay | Admitting: *Deleted

## 2016-05-17 ENCOUNTER — Telehealth: Payer: Self-pay | Admitting: Cardiology

## 2016-05-17 NOTE — Telephone Encounter (Signed)
Mr. Jainil Dacosta (son) called stating that father was recently inpatient at [facility] . States that he was told that he had a stroke. Mr. Ramakrishnan states that he was given prescription for a stroke that is going to cost over 400.00.  He called wanting Dr. Harl Bowie to advise about the medication  Please call [phone].

## 2016-05-17 NOTE — Telephone Encounter (Signed)
Was discharged on 04/30/16 from [facility] for stroke.  Son Marchia Bond) thought the medication was Aggrenox.  Informed son that office will request notes & have Dr. Harl Bowie review for clarification.  Dr. Harl Bowie back in this office on Wednesday.

## 2016-05-19 ENCOUNTER — Telehealth: Payer: Self-pay | Admitting: Cardiology

## 2016-05-19 MED ORDER — CLOPIDOGREL BISULFATE 75 MG PO TABS: 75.0000 mg | ORAL_TABLET | Freq: Every day | ORAL | 6 refills | Status: DC

## 2016-05-19 NOTE — Progress Notes (Signed)
GI history further reviewed from care everywhere GI note dated Jan 2012, pertinent history included below from prior notes.    Carlyle Dolly MD   Patient was in [facility] with hematocheazia. He had EGD and Colonoscopy by Dr.Oneil and Capsule endoscopy by Dr.Fein which was -ve. He had a bleeding scan which was -ve too. Similar things happened few years ago when he went to [facility] and w/up was -ve.  He bleeding restarted and he is having hematocheazia again x 2  He was originally on coumadin for [facility] course Pt had no further GIB while in hospital and had stable Hgb. EGD/colo were negative. Capsule study done and results pending. Dr. Curly Shores to call pt w/ results. Pt recommended to remain off coumadin given h/o GIB with no source found at this time. Ok to d/c home today.

## 2016-05-19 NOTE — Telephone Encounter (Signed)
Son Marchia Bond) notified.  Will send new medication to [facility].  OV scheduled for 06/14/2016 here in [place] office.  Informed to bring all medication bottles & insurance cards.

## 2016-05-19 NOTE — Telephone Encounter (Signed)
I would stop aggrenox and start plavix 75mg  daily. The best option would be to consider a blood thinner like coumadin or a new blood thinner like eliquis or xarelto, however based on his multiple issues with GI bleeds in the past I would be hesistant to start. Can he see me in 2-3 weeks to discuss further.    Zandra Abts MD

## 2016-06-14 ENCOUNTER — Ambulatory Visit: Payer: Medicare FFS | Admitting: Cardiology

## 2016-06-14 ENCOUNTER — Ambulatory Visit (INDEPENDENT_AMBULATORY_CARE_PROVIDER_SITE_OTHER): Payer: Medicare FFS | Admitting: Cardiology

## 2016-06-14 ENCOUNTER — Encounter: Payer: Self-pay | Admitting: Cardiology

## 2016-06-14 VITALS — BP 127/84 | HR 84 | Ht 71.0 in | Wt 162.8 lb

## 2016-06-14 DIAGNOSIS — I4891 Unspecified atrial fibrillation: Secondary | ICD-10-CM | POA: Diagnosis not present

## 2016-06-14 DIAGNOSIS — G458 Other transient cerebral ischemic attacks and related syndromes: Secondary | ICD-10-CM | POA: Diagnosis not present

## 2016-06-14 MED ORDER — APIXABAN 5 MG PO TABS: 5.0000 mg | ORAL_TABLET | Freq: Two times a day (BID) | ORAL | 3 refills | Status: DC

## 2016-06-14 NOTE — Patient Instructions (Addendum)
Your physician recommends that you schedule a follow-up appointment in: [place] DR. BRANCH  Your physician has recommended you make the following change in your medication:   HOLD ASPIRIN AND PLAVIX   START ELIQUIS 5 MG TWICE DAILY - Slovan A 30 DAY FREE COUPON   Your physician recommends that you return for lab work in: 2 WEEKS CBC/BMP  Thank you for choosing [facility]!!

## 2016-06-14 NOTE — Progress Notes (Signed)
Clinical Summary Thomas Costa is a 83 y.o.male seen today for f/u of the following medical problems. This is a focused visit to discuss his history of afib and recent TIA symptoms. For more detailed history please refer to prior clinic notes.    1. Afib - long history of afib according to notes - recent afib with RVR during 09/2015 admission at [facility] - previous hematemsis when on anticoagulant in the past, has not been on  - mention of sick sinus syndrome in the past, the specificis are not well described.   - he denies any significant palpitatoins since last visit.   - - acute episode of slurred speech, facial droop, confusion. Admitted to [facility]. Started on aggrenox at that admission, later changed to plavix due to cost. Appears at this time he is taking asa and plavix.    2. GI bleeding history - several old records reviewed to better understand his GI bleeding history including records from [place] and [place] - In 2010 admitted to [facility] with GI bleed. Received 2 units of pRBCs. EGD and colonoscopy unremarkable.  In 2012 patient was in [facility] with hematocheazia. He had EGD and Colonoscopy by Dr.Oneil and Capsule endoscopy by Dr.Fein which was negative. He had a bleeding scan which was negative. Similar things happened few years ago when he went to [facility] and w/up was negative.  He bleeding restarted and he is having hematocheazia again x 2  He was originally on coumadin for afib - Pt had no further GIB while in hospital and had stable Hgb. EGD/colo were negative. Capsule study done and results pending. Dr. Curly Shores to call pt w/ results. Pt recommended to remain off coumadin given h/o GIB with no source found at this time. Ok to d/c home today.  - admit 02/2012 with GI bleed while off anticoag, thought to be diverticular bleed. Received 2 units of pRBCs. Colonoscopy with large clots in cecum.   - he denies any recent bleeding  troubles. Appears to not have not had any issues since 2013.    3. TIA - acute episode of slurred speech, facial droop, confusion during recent admission at [facility]   Past Medical History:  Diagnosis Date  . Atrial fibrillation, permanent ([place])   . Chronic anemia   . Chronic leukopenia   . Congestive heart failure (CHF) ([place])   . Diabetes ([place])   . GERD (gastroesophageal reflux disease)   . Hyperlipidemia   . Hypertension   . Pancytopenia ([place])      No Known Allergies   Current Outpatient Prescriptions  Medication Sig Dispense Refill  . allopurinol (ZYLOPRIM) 300 MG tablet Take 300 mg by mouth daily.     [name] aspirin EC 81 MG tablet Take 162 mg by mouth daily.     . clopidogrel (PLAVIX) 75 MG tablet Take 1 tablet (75 mg total) by mouth daily. 30 tablet 6  . docusate sodium (COLACE) 100 MG capsule Take 100-200 mg by mouth daily as needed for mild constipation.    [name] ENTRESTO 24-26 MG TAKE ONE TABLET BY MOUTH TWICE DAILY 60 tablet 3  . folic acid (FOLVITE) 1 MG tablet Take 1 mg by mouth daily.     . furosemide (LASIX) 20 MG tablet Take 1 tablet (20 mg total) by mouth daily as needed for edema (swelling).    [name] HYDROcodone-acetaminophen (NORCO/VICODIN) 5-325 MG tablet Take 1 tablet by mouth every 6 (six) hours as needed for moderate pain.     [name]  ibuprofen (ADVIL,MOTRIN) 200 MG tablet Take 200 mg by mouth every 6 (six) hours as needed.    . metoprolol succinate (TOPROL-XL) 100 MG 24 hr tablet Take 1 tablet (100 mg total) by mouth 2 (two) times daily. 60 tablet 6  . omeprazole (PRILOSEC) 40 MG capsule Take 40 mg by mouth daily.     . potassium chloride SA (KLOR-CON M20) 20 MEQ tablet Take 1 tablet (20 mEq total) by mouth daily as needed (on the days you take your Lasix).    . sacubitril-valsartan (ENTRESTO) 24-26 MG Take 1 tablet by mouth 2 (two) times daily. 28 tablet 0   No current facility-administered medications for this visit.      Past Surgical History:  Procedure Laterality  Date  . 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 CARDIAC CATHETERIZATION N/A 12/03/2015   Procedure: Right/Left Heart Cath and Coronary Angiography;  Surgeon: Thomas M Martinique, MD;  Location: [place] CV [facility];  Service: Cardiovascular;  Laterality: N/A;  . PERIPHERAL VASCULAR CATHETERIZATION N/A 12/03/2015   Procedure: Thoracic Aortogram;  Surgeon: Thomas M Martinique, MD;  Location: [place] CV LAB;  Service: Cardiovascular;  Laterality: N/A;     No Known Allergies    Family History  Problem Relation Age of Onset  . Family history unknown: Yes     Social History Thomas Costa reports that he quit smoking about 21 years ago. His smoking use included Cigarettes. He started smoking about 66 years ago. He has a 23.00 pack-year smoking history. He has never used smokeless tobacco. Thomas Costa has no alcohol history on file.   Review of Systems CONSTITUTIONAL: No weight loss, fever, chills, weakness or fatigue.  HEENT: Eyes: No visual loss, blurred vision, double vision or yellow sclerae.No hearing loss, sneezing, congestion, runny nose or sore throat.  SKIN: No rash or itching.  CARDIOVASCULAR: per HPI RESPIRATORY: No shortness of breath, cough or sputum.  GASTROINTESTINAL: No anorexia, nausea, vomiting or diarrhea. No abdominal pain or blood.  GENITOURINARY: No burning on urination, no polyuria NEUROLOGICAL: No headache, dizziness, syncope, paralysis, ataxia, numbness or tingling in the extremities. No change in bowel or bladder control.  MUSCULOSKELETAL: No muscle, back pain, joint pain or stiffness.  LYMPHATICS: No enlarged nodes. No history of splenectomy.  PSYCHIATRIC: No history of depression or anxiety.  ENDOCRINOLOGIC: No reports of sweating, cold or heat intolerance. No polyuria or polydipsia.  [name]   Physical Examination Vitals:   06/14/16 1352  BP: 127/84  Pulse: 84   Vitals:   06/14/16 1352  Weight:  162 lb 12.8 oz (73.8 kg)  Height: 5\' 11"  (1.803 m)    Gen: resting comfortably, no acute distress HEENT: no scleral icterus, pupils equal round and reactive, no palptable cervical adenopathy,  CV: RRR, no m/r/g, no jvd Resp: Clear to auscultation bilaterally GI: abdomen is soft, non-tender, non-distended, normal bowel sounds, no hepatosplenomegaly MSK: extremities are warm, no edema.  Skin: warm, no rash Neuro:  no focal deficits Psych: appropriate affect   Diagnostic Studies 11/2015 LHC/RHC  Prox LAD to Mid LAD lesion, 20 %stenosed.  There is severe left ventricular systolic dysfunction.  The left ventricular ejection fraction is less than 25% by visual estimate.  There is mild (2+) mitral regurgitation.  LV end diastolic pressure is normal.  LV end diastolic pressure is normal.  1. Minor nonobstructive CAD 2. Severe LV enlargement with markedly reduced LV systolic function. 3. Moderate dilation of  proximal aorta. 4. Normal right heart pressures, normal LV filling pressures.  5. Mild MR Aortic Root: The aortic root displays moderate dilation. Plan: medical management.     Assessment and Plan  1. PAF - rate controlled. No recent palpitations - he had not been on anticoag recently due to prior history of multiple GI bleeds as described above - recent admission with TIA, concern for possible relation to afib - I discussed in detail with patient and his sone  the risks of CVA vs bleeding if we were to try him back on anticoagulation.  - based on our discussions and weighing of risks vs benefits, we will stop asa/plavix and start eliquis 5mg  bid.  - check cbc in next few weeks. Patient is to monitor stool for any evidence of bleeding.   2. TIA - stopping asa/plavix, starting elqius as described above.        Arnoldo Lenis, M.D

## 2016-06-24 ENCOUNTER — Ambulatory Visit: Payer: Medicare FFS | Admitting: Cardiology

## 2016-06-30 ENCOUNTER — Telehealth: Payer: Self-pay | Admitting: *Deleted

## 2016-06-30 DIAGNOSIS — I10 Essential (primary) hypertension: Secondary | ICD-10-CM

## 2016-06-30 NOTE — Telephone Encounter (Signed)
Erasmo Downer from [facility] called with potassium results of 2.8 - says she is faxing over results will have scanned into chart and route to Dr. Harl Bowie

## 2016-07-01 MED ORDER — POTASSIUM CHLORIDE CRYS ER 20 MEQ PO TBCR: EXTENDED_RELEASE_TABLET | ORAL | 3 refills | Status: DC

## 2016-07-01 NOTE — Telephone Encounter (Signed)
Pt son voiced understanding - will mail lab orders - updated medication list and sent rx to pharmacy as requested

## 2016-07-01 NOTE — Telephone Encounter (Signed)
Please see how often patient has been taking his KCl. Have him take 14mEq x 3 days, then take 14mEq daily. Needs repeat BMET/Mg in 2 weeks   Zandra Abts MD

## 2016-07-27 ENCOUNTER — Encounter: Payer: Self-pay | Admitting: *Deleted

## 2016-07-27 ENCOUNTER — Encounter: Payer: Self-pay | Admitting: Cardiology

## 2016-07-27 ENCOUNTER — Ambulatory Visit (INDEPENDENT_AMBULATORY_CARE_PROVIDER_SITE_OTHER): Payer: Medicare FFS | Admitting: Cardiology

## 2016-07-27 VITALS — BP 116/76 | HR 74 | Ht 71.0 in | Wt 164.4 lb

## 2016-07-27 DIAGNOSIS — I340 Nonrheumatic mitral (valve) insufficiency: Secondary | ICD-10-CM | POA: Diagnosis not present

## 2016-07-27 DIAGNOSIS — I4891 Unspecified atrial fibrillation: Secondary | ICD-10-CM

## 2016-07-27 DIAGNOSIS — I5023 Acute on chronic systolic (congestive) heart failure: Secondary | ICD-10-CM | POA: Diagnosis not present

## 2016-07-27 NOTE — Progress Notes (Signed)
Clinical Summary Thomas Costa is a 83 y.o.male seen today for follow up of the following medical problems.   1. Afib - long history of afib according to notes - recent afib with RVR during 09/2015 admission at [facility] - previous hematemsis when on anticoagulant in the past, has not been on  - mention of sick sinus syndrome in the past, the specificis are not well described. .   - no recent palpitations - no recent bleeding on eliqiuis.   2. GI bleeding history - several old records reviewed to better understand his GI bleeding history including records from [place] and [place] - In 2010 admitted to [facility] with GI bleed. Received 2 units of pRBCs. EGD and colonoscopy unremarkable.  In 2012 patient was in [facility] with hematocheazia. He had EGD and Colonoscopy by Dr.Oneil and Capsule endoscopy by Dr.Fein which was negative. He had a bleeding scan which was negative. Similar things happened few years ago when he went to [facility] and w/up was negative.  He bleeding restarted and he is having hematocheazia again x 2  He was originally on coumadin for afib - Pt had no further GIB while in hospital and had stable Hgb. EGD/colo were negative. Capsule study done and results pending. Dr. Curly Shores to call pt w/ results. Pt recommended to remain off coumadin given h/o GIB with no source found at this time. Ok to d/c home today. - admit 02/2012 with GI bleed while off anticoag, thought to be diverticular bleed. Received 2 units of pRBCs. Colonoscopy with large clots in cecum.   - has tolerated anticoagulation thus far without bleeding troubles.    3. History of  TIA - acute episode of slurred speech, facial droop, confusion during recent admission at [facility] - now on eliquis.     4. Chronic systolic HF - admit 12/3788 with CHF to [facility], appears to be new diagnosis -09/2015 echo [facility] LVEF 20-25%, moderate AI, modertate to severe MR,  - clinic note from  2013 mentions LVEF 55-60%, moderate MR.  - EKG LBBB of unknown duration.  - cath 11/2015 with mild nonobstructive CAD, CI 2.2, mean PA 23, PCWP 11    - SOB started about 3 weeks. Has had some leg swelling. DOE. Gained 2 pounds recently.  - no orthopnea. No recent palpitaitons. No cough, no fevers, no chills. - takes lasix 2 times daily over the last 2 weeks.    5. 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  - s/p CT guided biopsy   6Mitral regurgitation - long history of MR followed by previous cardiologist - by echo 09/2015 reported moderate to severe - echo Jan 2018 [facility] severe MR.    7. 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 Unclear if missed dilated portion.  - CT PE [facility] 09/2015 with mildy dilated ascedning aorta at 4.2 cm.      Past Medical History:  Diagnosis Date  . Atrial fibrillation, permanent ([place])   . Chronic anemia   . Chronic leukopenia   . Congestive heart failure (CHF) ([place])   . Diabetes ([place])   . GERD (gastroesophageal reflux disease)   . Hyperlipidemia   . Hypertension   . Pancytopenia ([place])      No Known Allergies   Current Outpatient Prescriptions  Medication Sig Dispense Refill  . allopurinol (ZYLOPRIM) 300  MG tablet Take 300 mg by mouth daily.     [name] apixaban (ELIQUIS) 5 MG TABS tablet Take 1 tablet (5 mg total) by mouth 2 (two) times daily. 60 tablet 3  . docusate sodium (COLACE) 100 MG capsule Take 100-200 mg by mouth daily as needed for mild constipation.    [name] ENTRESTO 24-26 MG TAKE ONE TABLET BY MOUTH TWICE DAILY 60 tablet 3  . folic acid (FOLVITE) 1 MG tablet Take 1 mg by mouth daily.     . furosemide (LASIX) 20 MG tablet Take 20 mg by mouth daily as needed.    [name] HYDROcodone-acetaminophen (NORCO/VICODIN) 5-325 MG tablet Take 1 tablet by mouth every 6 (six) hours as needed for  moderate pain.     [name] ibuprofen (ADVIL,MOTRIN) 200 MG tablet Take 200 mg by mouth every 6 (six) hours as needed.    . metoprolol succinate (TOPROL-XL) 100 MG 24 hr tablet Take 1 tablet (100 mg total) by mouth 2 (two) times daily. 60 tablet 6  . omeprazole (PRILOSEC) 40 MG capsule Take 40 mg by mouth daily.     . potassium chloride SA (K-DUR,KLOR-CON) 20 MEQ tablet TAKE 40 MEQ FOR 3 DAYS THEN TAKE 20 MEQ DAILY 90 tablet 3   No current facility-administered medications for this visit.      Past Surgical History:  Procedure Laterality Date  . 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 CARDIAC CATHETERIZATION N/A 12/03/2015   Procedure: Right/Left Heart Cath and Coronary Angiography;  Surgeon: Peter M Martinique, MD;  Location: [place] CV LAB;  Service: Cardiovascular;  Laterality: N/A;  . PERIPHERAL VASCULAR CATHETERIZATION N/A 12/03/2015   Procedure: Thoracic Aortogram;  Surgeon: Peter M Martinique, MD;  Location: [place] CV LAB;  Service: Cardiovascular;  Laterality: N/A;     No Known Allergies    Family History  Problem Relation Age of Onset  . Family history unknown: Yes     Social History Thomas Costa reports that he quit smoking about 21 years ago. His smoking use included Cigarettes. He started smoking about 66 years ago. He has a 23.00 pack-year smoking history. He has never used smokeless tobacco. Thomas Costa has no alcohol history on file.   Review of Systems CONSTITUTIONAL: No weight loss, fever, chills, weakness or fatigue.  HEENT: Eyes: No visual loss, blurred vision, double vision or yellow sclerae.No hearing loss, sneezing, congestion, runny nose or sore throat.  SKIN: No rash or itching.  CARDIOVASCULAR: per hpi RESPIRATORY: No shortness of breath, cough or sputum.  GASTROINTESTINAL: No anorexia, nausea, vomiting or diarrhea. No abdominal pain or blood.  GENITOURINARY: No burning on urination, no  polyuria NEUROLOGICAL: No headache, dizziness, syncope, paralysis, ataxia, numbness or tingling in the extremities. No change in bowel or bladder control.  MUSCULOSKELETAL: No muscle, back pain, joint pain or stiffness.  LYMPHATICS: No enlarged nodes. No history of splenectomy.  PSYCHIATRIC: No history of depression or anxiety.  ENDOCRINOLOGIC: No reports of sweating, cold or heat intolerance. No polyuria or polydipsia.  [name]   Physical Examination Vitals:   07/27/16 1303  BP: 116/76  Pulse: 74   Vitals:   07/27/16 1303  Weight: 164 lb 6.4 oz (74.6 kg)  Height: 5\' 11"  (1.803 m)    Gen: resting comfortably, no acute distress HEENT: no scleral icterus, pupils equal round and reactive, no palptable cervical adenopathy,  CV: RRR, no m/r/g, no jvd Resp: Clear to auscultation bilaterally GI:  abdomen is soft, non-tender, non-distended, normal bowel sounds, no hepatosplenomegaly MSK: extremities are warm, no edema.  Skin: warm, no rash Neuro:  no focal deficits Psych: appropriate affect   Diagnostic Studies 11/2015 LHC/RHC  Prox LAD to Mid LAD lesion, 20 %stenosed.  There is severe left ventricular systolic dysfunction.  The left ventricular ejection fraction is less than 25% by visual estimate.  There is mild (2+) mitral regurgitation.  LV end diastolic pressure is normal.  LV end diastolic pressure is normal.  1. Minor nonobstructive CAD 2. Severe LV enlargement with markedly reduced LV systolic function. 3. Moderate dilation of proximal aorta. 4. Normal right heart pressures, normal LV filling pressures.  5. Mild MR Aortic Root: The aortic root displays moderate dilation. Plan: medical management.    Jan 2018 Echo [facility] LVEF 25-30%, severe MR.    Assessment and Plan  1. PAF - rate controlled.  - CHADS2Vasc score is 6, continue anticoagulation.   2. Acute on chronic systolic HF - some increased weight and SOB. Increase lasix to 40mg  in AM and 20mg   in PM x 3 days, then resume 20mg  daily - some lightheadedness at times, will not titrate CHF meds further today  3. Mitral regurgitation - increase lasix as described above   F/u 2 months     Arnoldo Lenis, M.D.

## 2016-07-27 NOTE — Patient Instructions (Signed)
Your physician recommends that you schedule a follow-up appointment in: [place] has recommended you make the following change in your medication:   TAKE LASIX 40 MG (2 TABLETS) IN THE MORNING AND 20 MG (1 TABLET) IN THE EVENING FOR 3 DAYS THEN TAKE AS NEEDED   Thank you for choosing [facility]!!

## 2016-07-28 ENCOUNTER — Telehealth: Payer: Self-pay | Admitting: *Deleted

## 2016-07-28 DIAGNOSIS — I10 Essential (primary) hypertension: Secondary | ICD-10-CM

## 2016-07-28 MED ORDER — POTASSIUM CHLORIDE CRYS ER 20 MEQ PO TBCR: 40.0000 meq | EXTENDED_RELEASE_TABLET | Freq: Every day | ORAL | 0 refills | Status: DC

## 2016-07-28 NOTE — Telephone Encounter (Signed)
-----   Message from Arnoldo Lenis, MD sent at 07/27/2016  2:54 PM EDT ----- Please let patient know that potassium is better but still low. I would take KCl 94meq daily. Repeat BMET, Mg, cbc in 1 month   Zandra Abts MD

## 2016-07-28 NOTE — Telephone Encounter (Signed)
Pt son voiced understanding  - new rx sent to pharmacy - lab orders mailed to pt home

## 2016-08-16 ENCOUNTER — Other Ambulatory Visit: Payer: Self-pay | Admitting: Cardiology

## 2016-09-28 ENCOUNTER — Encounter: Payer: Self-pay | Admitting: Cardiology

## 2016-09-28 ENCOUNTER — Ambulatory Visit (INDEPENDENT_AMBULATORY_CARE_PROVIDER_SITE_OTHER): Payer: Medicare FFS | Admitting: Cardiology

## 2016-09-28 VITALS — BP 101/65 | HR 58 | Ht 71.0 in | Wt 163.2 lb

## 2016-09-28 DIAGNOSIS — I340 Nonrheumatic mitral (valve) insufficiency: Secondary | ICD-10-CM | POA: Diagnosis not present

## 2016-09-28 DIAGNOSIS — I4891 Unspecified atrial fibrillation: Secondary | ICD-10-CM | POA: Diagnosis not present

## 2016-09-28 DIAGNOSIS — I5022 Chronic systolic (congestive) heart failure: Secondary | ICD-10-CM

## 2016-09-28 NOTE — Patient Instructions (Signed)
Your physician recommends that you schedule a follow-up appointment in: [place] has recommended you make the following change in your medication:   TAKE LASIX 20 MG DAILY  Your physician recommends that you return for lab work CBC/MG/BMP  Thank you for choosing [facility]!!

## 2016-09-28 NOTE — Progress Notes (Signed)
Clinical Summary Mr. Petit is a 83 y.o.male seen today for follow up of the following medical problems.   1. Afib - long history of afib according to notes - recent afib with RVR during 09/2015 admission at [facility] - previous hematemsis when on anticoagulant in the past, has not been on  - mention of sick sinus syndrome in the past, the specificis are not well described. .   - no recent palpitations. No recent bleeding on anticoag.   2. GI bleedinghistory - several old records reviewed to better understand his GI bleeding history including records from [place] and [place] - In 2010 admitted to [facility] with GI bleed. Received 2 units of pRBCs. EGD and colonoscopy unremarkable.  In 2012 patient was in [facility] with hematocheazia. He had EGD and Colonoscopy by Dr.Oneil and Capsule endoscopy by Dr.Fein which was negative. He had a bleeding scan which was negative. Similar things happened few years ago when he went to [facility] and w/up was negative.  He bleeding restarted and he is having hematocheazia again x 2  He was originally on coumadin for afib - Pt had no further GIB while in hospital and had stable Hgb. EGD/colo were negative. Capsule study done and results pending. Dr. Curly Shores to call pt w/ results. Pt recommended to remain off coumadin given h/o GIB with no source found at this time. Ok to d/c home today. - admit 02/2012 with GI bleed while off anticoag, thought to be diverticular bleed. Received 2 units of pRBCs. Colonoscopy with large clots in cecum.   - has tolerated anticoagulation thus far without bleeding troubles.   3. History of  TIA - acute episode of slurred speech, facial droop, confusion during recent admission at [facility] - now on eliquis.   - no recent symptoms.     4. Chronic systolic HF - admit 11/1827 with CHF to [facility], appears to be new diagnosis -09/2015 echo [facility] LVEF 20-25%, moderate AI, modertate to severe  MR,  - clinic note from 2013 mentions LVEF 55-60%, moderate MR.  - EKG LBBB of unknown duration.  - cath 11/2015 with mild nonobstructive CAD, CI 2.2, mean PA 23, PCWP 11 - Jan 2018 echo [facility]: LVEF 25-30%, mild RV dysfunction, severe MR, mod TR, restrictive diastolic function  - no recent edema. No recent SOB/DOE.    5. 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  - s/p CT guided biopsy  - followed by CT surgery.    6Mitral regurgitation - long history of MR followed by previous cardiologist - by echo 09/2015 reported moderate to severe - echo Jan 2018 [facility] severe MR.   - no recent symptoms   7. 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 Unclear if missed dilated portion.  - CT PE [facility] 09/2015 with mildy dilated ascedning aorta at 4.2 cm.      Past Medical History:  Diagnosis Date  . Atrial fibrillation, permanent ([place])   . Chronic anemia   . Chronic leukopenia   . Congestive heart failure (CHF) ([place])   . Diabetes ([place])   . GERD (gastroesophageal reflux disease)   . Hyperlipidemia   . Hypertension   . Pancytopenia ([place])      No Known Allergies   Current Outpatient Prescriptions  Medication Sig Dispense Refill  . allopurinol (ZYLOPRIM) 300 MG tablet Take  300 mg by mouth daily.     [name] apixaban (ELIQUIS) 5 MG TABS tablet Take 1 tablet (5 mg total) by mouth 2 (two) times daily. 60 tablet 3  . docusate sodium (COLACE) 100 MG capsule Take 100-200 mg by mouth daily as needed for mild constipation.    [name] ENTRESTO 24-26 MG TAKE ONE TABLET BY MOUTH TWICE DAILY 626 tablet 1  . folic acid (FOLVITE) 1 MG tablet Take 1 mg by mouth daily.     . furosemide (LASIX) 20 MG tablet Take 20 mg by mouth daily as needed.    [name] ibuprofen (ADVIL,MOTRIN) 200 MG tablet Take 200 mg by mouth every 6 (six) hours as needed.    .  metoprolol succinate (TOPROL-XL) 100 MG 24 hr tablet Take 1 tablet (100 mg total) by mouth 2 (two) times daily. 60 tablet 6  . omeprazole (PRILOSEC) 40 MG capsule Take 40 mg by mouth daily.     . potassium chloride SA (K-DUR,KLOR-CON) 20 MEQ tablet Take 2 tablets (40 mEq total) by mouth daily. 180 tablet 0   No current facility-administered medications for this visit.      Past Surgical History:  Procedure Laterality Date  . 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 CARDIAC CATHETERIZATION N/A 12/03/2015   Procedure: Right/Left Heart Cath and Coronary Angiography;  Surgeon: Peter M Martinique, MD;  Location: [place] [facility];  Service: Cardiovascular;  Laterality: N/A;  . PERIPHERAL VASCULAR CATHETERIZATION N/A 12/03/2015   Procedure: Thoracic Aortogram;  Surgeon: Peter M Martinique, MD;  Location: [place] [facility];  Service: Cardiovascular;  Laterality: N/A;     No Known Allergies    Family History  Problem Relation Age of Onset  . Family history unknown: Yes     Social History Mr. Soulier reports that he quit smoking about 21 years ago. His smoking use included Cigarettes. He started smoking about 66 years ago. He has a 23.00 pack-year smoking history. He has never used smokeless tobacco. Mr. Weaver has no alcohol history on file.   Review of Systems CONSTITUTIONAL: No weight loss, fever, chills, weakness or fatigue.  HEENT: Eyes: No visual loss, blurred vision, double vision or yellow sclerae.No hearing loss, sneezing, congestion, runny nose or sore throat.  SKIN: No rash or itching.  CARDIOVASCULAR: per hpi RESPIRATORY: No shortness of breath, cough or sputum.  GASTROINTESTINAL: No anorexia, nausea, vomiting or diarrhea. No abdominal pain or blood.  GENITOURINARY: No burning on urination, no polyuria NEUROLOGICAL: No headache, dizziness, syncope, paralysis, ataxia, numbness or tingling in the extremities.  No change in bowel or bladder control.  MUSCULOSKELETAL: No muscle, back pain, joint pain or stiffness.  LYMPHATICS: No enlarged nodes. No history of splenectomy.  PSYCHIATRIC: No history of depression or anxiety.  ENDOCRINOLOGIC: No reports of sweating, cold or heat intolerance. No polyuria or polydipsia.  [name]   Physical Examination Vitals:   09/28/16 1329  BP: 101/65  Pulse: (!) 58   Vitals:   09/28/16 1329  Weight: 163 lb 3.2 oz (74 kg)  Height: 5\' 11"  (1.803 m)    Gen: resting comfortably, no acute distress HEENT: no scleral icterus, pupils equal round and reactive, no palptable cervical adenopathy,  CV: RRR, 2/6 systolic murmur at apex, no jvd Resp: Clear to auscultation bilaterally GI: abdomen is soft, non-tender, non-distended, normal bowel sounds, no hepatosplenomegaly MSK: extremities are warm, no edema.  Skin: warm, no rash Neuro:  no focal  deficits Psych: appropriate affect   Diagnostic Studies 11/2015 LHC/RHC  Prox LAD to Mid LAD lesion, 20 %stenosed.  There is severe left ventricular systolic dysfunction.  The left ventricular ejection fraction is less than 25% by visual estimate.  There is mild (2+) mitral regurgitation.  LV end diastolic pressure is normal.  LV end diastolic pressure is normal.  1. Minor nonobstructive CAD 2. Severe LV enlargement with markedly reduced LV systolic function. 3. Moderate dilation of proximal aorta. 4. Normal right heart pressures, normal LV filling pressures.  5. Mild MR Aortic Root: The aortic root displays moderate dilation. Plan: medical management.    Jan 2018 Echo [facility] LVEF 25-30%, severe MR.       Assessment and Plan  1. PAF - rate controlled, no recent symptoms - CHADS2Vasc score is 6, he will continue anticoagulation.   2. Chronic systolic HF - appears euvolemic - lower lasix down to 20mg  daily  3. Mitral regurgitation - no recent symptoms  Repeat labs   F/u 3  months      Arnoldo Lenis, M.D.

## 2016-10-08 ENCOUNTER — Telehealth: Payer: Self-pay

## 2016-10-08 NOTE — Telephone Encounter (Signed)
-----   Message from Arnoldo Lenis, MD sent at 10/08/2016 10:33 AM EDT ----- Labs look ok  J branch MD

## 2016-10-08 NOTE — Telephone Encounter (Signed)
Patient notified. Routed to PCP 

## 2016-10-09 ENCOUNTER — Other Ambulatory Visit: Payer: Self-pay | Admitting: Cardiology

## 2016-10-21 IMAGING — PT NM PET TUM IMG INITIAL (PI) SKULL BASE T - THIGH
1 of 8 series · 1 of 25 positions shown · non-contrast
Comparison: CT chest dated 10/20/2015. CT abdomen pelvis dated
10/18/2015.

CLINICAL DATA: Initial treatment strategy for mediastinal mass.
History of small bowel carcinoid with mesenteric nodes, status post
resection.

EXAM:
NUCLEAR MEDICINE PET SKULL BASE TO THIGH
TECHNIQUE: 8.5 mCi F-18 FDG was injected intravenously. Full-ring PET imaging
was performed from the skull base to thigh after the radiotracer. CT
data was obtained and used for attenuation correction and anatomic
localization.
FASTING BLOOD GLUCOSE:  Value: 91 mg/dl

[Series 4: ct sk_thigh 5.0 b31f · axial · 5.0mm · 0.98mm/px · 1 of 224 slices shown]
[im 224/224  brain]
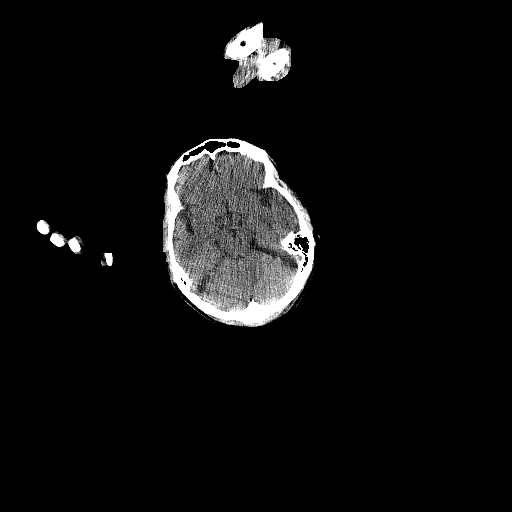

[1 of 25 positions shown; findings below may reference images not displayed]

FINDINGS: NECK

No hypermetabolic lymph nodes in the neck.

2.2 x 1.9 cm polypoid lesion along the medial wall of the left
maxillary sinus (series 4/ image 13), max SUV 9.6. Given focal
hypermetabolism and the polypoid appearance, this at least raises
concern for a maxillary sinus polyp, although sinusitis is certainly
more common. Metastatic disease to this location would be considered
unusual.

CHEST

[DATE] x 6.6 cm anterior mediastinal mass (series 4/image 70), max SUV
14.1. Mixed low-density anteriorly within the lesion (series 4/
image 71), but no definite fat density or calcification to suggest a
germ-cell tumor. Primary differential considerations include
lymphoma or thymoma.

Additional small mediastinal lymph nodes, including an 11 mm short
axis low right paratracheal node (series 4/ image 69) and a 9 mm
short axis AP window node (series 4/ image 64). However, these
demonstrate only mild hypermetabolism, max SUV 3.8, and are
therefore favored to be reactive.

10 mm short axis right axillary node (series 4/image 57), max SUV
7.9, raising concern for nodal metastasis despite the small size.

No suspicious pulmonary nodules. Trace bilateral pleural effusions.
Septation is still coarse No pneumothorax.

Cardiomegaly. No pericardial effusion. Three vessel coronary
atherosclerosis. Atherosclerotic calcifications of the aortic arch.

ABDOMEN/PELVIS

No abnormal hypermetabolic activity within the liver, pancreas,
adrenal glands, or spleen.

Postsurgical changes related to prior small bowel resection with
resection of previous mediastinal mass.

Scattered hepatic cysts and large bilateral renal cysts.
Atherosclerotic calcifications the abdominal aorta and branch
vessels. Prostatomegaly, with enlargement of the central gland which
indents the base of the bladder.

No hypermetabolic lymph nodes in the abdomen or pelvis.

SKELETON

No focal hypermetabolic activity to suggest skeletal metastasis.
IMPRESSION: 4.2 x 6.6 cm hypermetabolic anterior mediastinal mass. Primary
differential considerations include lymphoma or thymoma.

10 mm short axis hypermetabolic right axillary node, raising the
possibility of nodal metastasis. Additional mediastinal nodes do not
demonstrate appreciable hypermetabolism.

2.2 x 1.9 cm hypermetabolic polypoid lesion along the medial wall of
the left maxillary sinus, possibly reflecting a benign or malignant
maxillary sinus polyp. Metastatic disease to this location would be
considered unusual. Consider maxillofacial MRI with/without contrast
for further characterization as clinically warranted.

## 2016-11-12 ENCOUNTER — Other Ambulatory Visit: Payer: Self-pay | Admitting: Cardiology

## 2016-12-17 ENCOUNTER — Ambulatory Visit (INDEPENDENT_AMBULATORY_CARE_PROVIDER_SITE_OTHER): Payer: Medicare FFS | Admitting: Cardiology

## 2016-12-17 ENCOUNTER — Encounter: Payer: Self-pay | Admitting: Cardiology

## 2016-12-17 VITALS — BP 96/58 | HR 54 | Ht 71.0 in | Wt 164.2 lb

## 2016-12-17 DIAGNOSIS — I340 Nonrheumatic mitral (valve) insufficiency: Secondary | ICD-10-CM

## 2016-12-17 DIAGNOSIS — I4891 Unspecified atrial fibrillation: Secondary | ICD-10-CM | POA: Diagnosis not present

## 2016-12-17 DIAGNOSIS — I5022 Chronic systolic (congestive) heart failure: Secondary | ICD-10-CM | POA: Diagnosis not present

## 2016-12-17 MED ORDER — METOPROLOL SUCCINATE ER 50 MG PO TB24: ORAL_TABLET | ORAL | 1 refills | Status: DC

## 2016-12-17 MED ORDER — FUROSEMIDE 20 MG PO TABS: 20.0000 mg | ORAL_TABLET | Freq: Every day | ORAL | 1 refills | Status: DC | PRN

## 2016-12-17 NOTE — Patient Instructions (Signed)
Your physician wants you to follow-up in: [place] has recommended you make the following change in your medication:   CHANGE TOPROL XL 75 MG (1 AND 1/2 TABLETS) TWICE DAILY   CHANGE LASIX 20 MG DAILY AS NEEDED FOR SWELLING/WEIGHT GAIN OVER 3LBS OVERNIGHT  Thank you for choosing [facility]!!

## 2016-12-17 NOTE — Progress Notes (Signed)
Clinical Summary Mr. Burch is a 83 y.o.male seen today for follow up of the following medical problems.   1. Afib - long history of afib according to notes - recent afib with RVR during 09/2015 admission at [facility] - previous hematemsis when on anticoagulant in the past, has not been on  - mention of sick sinus syndrome in the past, the specificis are not well described. .    - no recent palpitations. - no bleeding on eliquis  2. GI bleedinghistory - several old records reviewed to better understand his GI bleeding history including records from [place] and [place] - In 2010 admitted to [facility] with GI bleed. Received 2 units of pRBCs. EGD and colonoscopy unremarkable.  In 2012 patient was in [facility] with hematocheazia. He had EGD and Colonoscopy by Dr.Oneil and Capsule endoscopy by Dr.Fein which was negative. He had a bleeding scan which was negative. Similar things happened few years ago when he went to [facility] and w/up was negative.  He bleeding restarted and he is having hematocheazia again x 2  He was originally on coumadin for afib - Pt had no further GIB while in hospital and had stable Hgb. EGD/colo were negative. Capsule study done and results pending. Dr. Curly Shores to call pt w/ results. Pt recommended to remain off coumadin given h/o GIB with no source found at this time. Ok to d/c home today. - admit 02/2012 with GI bleed while off anticoag, thought to be diverticular bleed. Received 2 units of pRBCs. Colonoscopy with large clots in cecum.   - has tolerated anticoagulation thus far without bleeding troubles.   3. History of TIA - acute episode of slurred speech, facial droop, confusion during recent admission at [facility] - now on eliquis.   - no recurrent symptoms since last visit, remains on anticoag for afib    4. Chronic systolic HF - admit 12/1692 with CHF to [facility], appears to be new diagnosis -09/2015 echo [facility] LVEF  20-25%, moderate AI, modertate to severe MR,  - clinic note from 2013 mentions LVEF 55-60%, moderate MR.  - EKG LBBB of unknown duration.  - cath 11/2015 with mild nonobstructive CAD, CI 2.2, mean PA 23, PCWP 11 - Jan 2018 echo [facility]: LVEF 25-30%, mild RV dysfunction, severe MR, mod TR, restrictive diastolic function   - has some orthostatic symptoms at times - no recent edema. No SOB/DOE.  5. 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  - s/p CT guided biopsy  - followed by CT surgery.    6Mitral regurgitation - long history of MR followed by previous cardiologist - by echo 09/2015 reported moderate to severe - echo Jan 2018 [facility] severe MR.   -he denies any recent symptoms   7. 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 Unclear if missed dilated portion.  - CT PE [facility] 09/2015 with mildy dilated ascedning aorta at 4.2 cm.     Past Medical History:  Diagnosis Date  . Atrial fibrillation, permanent ([place])   . Chronic anemia   . Chronic leukopenia   . Congestive heart failure (CHF) ([place])   . Diabetes ([place])   . GERD (gastroesophageal reflux disease)   . Hyperlipidemia   . Hypertension   . Pancytopenia ([place])      No Known Allergies   Current Outpatient Prescriptions  Medication  Sig Dispense Refill  . allopurinol (ZYLOPRIM) 300 MG tablet Take 300 mg by mouth daily.     [name] docusate sodium (COLACE) 100 MG capsule Take 100-200 mg by mouth daily as needed for mild constipation.    [name] ELIQUIS 5 MG TABS tablet TAKE 1 TABLET BY MOUTH TWICE DAILY 60 tablet 3  . ENTRESTO 24-26 MG TAKE ONE TABLET BY MOUTH TWICE DAILY 350 tablet 1  . folic acid (FOLVITE) 1 MG tablet Take 1 mg by mouth daily.     . furosemide (LASIX) 20 MG tablet Take 20 mg by mouth daily.    [name] ibuprofen (ADVIL,MOTRIN) 200 MG tablet Take 200 mg by mouth  every 6 (six) hours as needed.    . magnesium oxide (MAG-OX) 400 MG tablet Take 400 mg by mouth 2 (two) times daily.    . metoprolol succinate (TOPROL-XL) 100 MG 24 hr tablet TAKE ONE TABLET BY MOUTH TWICE DAILY 60 tablet 6  . omeprazole (PRILOSEC) 40 MG capsule Take 40 mg by mouth daily.     . potassium chloride SA (K-DUR,KLOR-CON) 20 MEQ tablet Take 40 mEq by mouth 2 (two) times daily.     No current facility-administered medications for this visit.      Past Surgical History:  Procedure Laterality Date  . 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 CARDIAC CATHETERIZATION N/A 12/03/2015   Procedure: Right/Left Heart Cath and Coronary Angiography;  Surgeon: Peter M Martinique, MD;  Location: [place] [facility];  Service: Cardiovascular;  Laterality: N/A;  . PERIPHERAL VASCULAR CATHETERIZATION N/A 12/03/2015   Procedure: Thoracic Aortogram;  Surgeon: Peter M Martinique, MD;  Location: [place] [facility];  Service: Cardiovascular;  Laterality: N/A;     No Known Allergies    Family History  Problem Relation Age of Onset  . Family history unknown: Yes     Social History Mr. Zerbe reports that he quit smoking about 21 years ago. His smoking use included Cigarettes. He started smoking about 67 years ago. He has a 23.00 pack-year smoking history. He has never used smokeless tobacco. Mr. Klarich has no alcohol history on file.   Review of Systems CONSTITUTIONAL: No weight loss, fever, chills, weakness or fatigue.  HEENT: Eyes: No visual loss, blurred vision, double vision or yellow sclerae.No hearing loss, sneezing, congestion, runny nose or sore throat.  SKIN: No rash or itching.  CARDIOVASCULAR: per hpi RESPIRATORY: No shortness of breath, cough or sputum.  GASTROINTESTINAL: No anorexia, nausea, vomiting or diarrhea. No abdominal pain or blood.  GENITOURINARY: No burning on urination, no  polyuria NEUROLOGICAL:occasional dizziness with standing MUSCULOSKELETAL: No muscle, back pain, joint pain or stiffness.  LYMPHATICS: No enlarged nodes. No history of splenectomy.  PSYCHIATRIC: No history of depression or anxiety.  ENDOCRINOLOGIC: No reports of sweating, cold or heat intolerance. No polyuria or polydipsia.  [name]   Physical Examination Vitals:   12/17/16 1404  BP: (!) 96/58  Pulse: (!) 54  SpO2: 98%   Vitals:   12/17/16 1404  Weight: 164 lb 3.2 oz (74.5 kg)  Height: 5\' 11"  (1.803 m)    Gen: resting comfortably, no acute distress HEENT: no scleral icterus, pupils equal round and reactive, no palptable cervical adenopathy,  CV: irreg, no m/r,g no jvd Resp: Clear to auscultation bilaterally GI: abdomen is soft, non-tender, non-distended, normal bowel sounds, no hepatosplenomegaly MSK: extremities are warm, no edema.  Skin: warm, no rash Neuro:  no focal  deficits Psych: appropriate affect   Diagnostic Studies 11/2015 LHC/RHC  Prox LAD to Mid LAD lesion, 20 %stenosed.  There is severe left ventricular systolic dysfunction.  The left ventricular ejection fraction is less than 25% by visual estimate.  There is mild (2+) mitral regurgitation.  LV end diastolic pressure is normal.  LV end diastolic pressure is normal.  1. Minor nonobstructive CAD 2. Severe LV enlargement with markedly reduced LV systolic function. 3. Moderate dilation of proximal aorta. 4. Normal right heart pressures, normal LV filling pressures.  5. Mild MR Aortic Root: The aortic root displays moderate dilation. Plan: medical management.    Jan 2018 Echo [facility] LVEF 25-30%, severe MR.       Assessment and Plan  1. PAF - no recent symptoms - CHADS2Vasc score is 6, continue anticoagulation.   2. Chronic systolic HF - appears euvolemic in clinic, no recent symptoms - has some orthostatic symptoms. We will lower lasix to 20mg  prn only. Change Toprol XL to 75mg   bid in setting of orthostatic symptoms and some bradycardia in clinic today.   3. Mitral regurgitation - no recent symptoms - we will continue to monitor    F/u 4 months      Arnoldo Lenis, M.D.

## 2016-12-20 ENCOUNTER — Other Ambulatory Visit: Payer: Self-pay

## 2016-12-20 MED ORDER — METOPROLOL SUCCINATE ER 25 MG PO TB24: 25.0000 mg | ORAL_TABLET | Freq: Every day | ORAL | 3 refills | Status: DC

## 2016-12-20 MED ORDER — METOPROLOL SUCCINATE ER 50 MG PO TB24: ORAL_TABLET | ORAL | 3 refills | Status: DC

## 2016-12-22 ENCOUNTER — Other Ambulatory Visit: Payer: Self-pay

## 2017-01-06 ENCOUNTER — Ambulatory Visit: Payer: Medicare FFS | Admitting: Cardiology

## 2017-02-02 NOTE — Progress Notes (Signed)
Cardiology Office Note   Date:  02/03/2017   ID:  Thomas Garner., DOB [birth year], MRN [id number]  PCP:  Lonia Mad, MD  Cardiologist:  Carlyle Dolly, MD  Chief Complaint  Patient presents with  . AAA  . Atrial Fibrillation  . Hypertension      History of Present Illness: Thomas Keenum. is a 83 y.o. male who presents for ongoing assessment and management of atrial fib, continued on anticoagulation with Eliquis, CHADS VASC Score of 6, hx of GIB without recurrence on anticoagulation, hypertension, hyperlipidemia, chronic anemia, Type II diabetes, AAA most recent CT documented size at 4.2 cm.   Was last seen in the office by Dr. Harl Bowie on 12/17/2016 with medication changes to include decreased dose of lasix to 20 mg prn, Toprol XL was changed to 75 mg daily in the setting of orthostatic BP. He was to return in 4 months,   He comes today with his son who states that he has been getting dizzy and slurring his words. Review of his medications ahs him taking 3 different doses of metoprolol twice a day. He states that he does not take lasix daily, only prn. He denies bleeding or melena.   Past Medical History:  Diagnosis Date  . Atrial fibrillation, permanent ([place])   . Chronic anemia   . Chronic leukopenia   . Congestive heart failure (CHF) ([place])   . Diabetes ([place])   . GERD (gastroesophageal reflux disease)   . Hyperlipidemia   . Hypertension   . Pancytopenia [facility])     Past Surgical History:  Procedure Laterality Date  . 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 CARDIAC CATHETERIZATION N/A 12/03/2015   Procedure: Right/Left Heart Cath and Coronary Angiography;  Surgeon: Peter M Martinique, MD;  Location: [place] [facility];  Service: Cardiovascular;  Laterality: N/A;  . PERIPHERAL VASCULAR CATHETERIZATION N/A 12/03/2015   Procedure: Thoracic Aortogram;  Surgeon: Peter M Martinique, MD;  Location: [place] [facility];   Service: Cardiovascular;  Laterality: N/A;     Current Outpatient Prescriptions  Medication Sig Dispense Refill  . allopurinol (ZYLOPRIM) 300 MG tablet Take 300 mg by mouth daily.     [name] docusate sodium (COLACE) 100 MG capsule Take 100-200 mg by mouth daily as needed for mild constipation.    [name] ELIQUIS 5 MG TABS tablet TAKE 1 TABLET BY MOUTH TWICE DAILY 60 tablet 3  . ENTRESTO 24-26 MG TAKE ONE TABLET BY MOUTH TWICE DAILY 962 tablet 1  . folic acid (FOLVITE) 1 MG tablet Take 1 mg by mouth daily.     . furosemide (LASIX) 20 MG tablet Take 1 tablet (20 mg total) by mouth daily as needed. 90 tablet 1  . ibuprofen (ADVIL,MOTRIN) 200 MG tablet Take 200 mg by mouth every 6 (six) hours as needed.    . isosorbide mononitrate (IMDUR) 30 MG 24 hr tablet Take 15 mg by mouth every morning.    . magnesium oxide (MAG-OX) 400 MG tablet Take 400 mg by mouth 2 (two) times daily.    . metoprolol succinate (TOPROL XL) 25 MG 24 hr tablet Take 1 tablet (25 mg total) by mouth daily. 90 tablet 3  . metoprolol succinate (TOPROL-XL) 100 MG 24 hr tablet Take 150 mg by mouth 2 (two) times daily. Take with or immediately following a meal.    . metoprolol succinate (TOPROL-XL) 50 MG 24 hr tablet Take 1 tablet daily 90  tablet 3  . omeprazole (PRILOSEC) 40 MG capsule Take 40 mg by mouth daily.     . potassium chloride SA (K-DUR,KLOR-CON) 20 MEQ tablet Take 40 mEq by mouth 2 (two) times daily.     No current facility-administered medications for this visit.     Allergies:   Patient has no known allergies.    Social History:  The patient  reports that he quit smoking about 21 years ago. His smoking use included Cigarettes. He started smoking about 67 years ago. He has a 23.00 pack-year smoking history. He has never used smokeless tobacco.   Family History:  The patient's Family history is unknown by patient.    ROS: All other systems are reviewed and negative. Unless otherwise mentioned in H&P    PHYSICAL  EXAM: VS:  BP 130/70   Pulse 86   Ht 5\' 11"  (1.803 m)   Wt 163 lb (73.9 kg)   SpO2 92%   BMI 22.73 kg/m  , BMI Body mass index is 22.73 kg/m. GEN: Well nourished, well developed, in no acute distress  HEENT: normal  Neck: no JVD, carotid bruits, or masses Cardiac: RRR; bradycardic no murmurs, rubs, or gallops,no edema  Respiratory:  clear to auscultation bilaterally, normal work of breathing GI: soft, nontender, nondistended, + BS MS: no deformity or atrophy  Skin: warm and dry, no rash Neuro:  Strength and sensation are intact Psych: euthymic mood, full affect   EKG: The ekg ordered today demonstrates Atrial fibrillation, rate of  68 bpm.    Recent Labs: No results found for requested labs within last 8760 hours.    Lipid Panel No results found for: CHOL, TRIG, HDL, CHOLHDL, VLDL, LDLCALC, LDLDIRECT   Other Studies Reviewed   Echocardiogram: 05/04/2016 transcribed from scanned document The left ventricular chamber size is mildly dilated. Mild concentric left ventricular hypertrophy is observed. Severe global hypokinesis of the left ventricle is observed. There is a severely decreased left ventricular systolic function. Estimated EF is 25% to 30%. Abnormal left ventricular diastolic function is observed. The left ventricular diastolic filling pattern is restrictive, upgraded   Wt Readings from Last 3 Encounters:  02/03/17 163 lb (73.9 kg)  12/17/16 164 lb 3.2 oz (74.5 kg)  09/28/16 163 lb 3.2 oz (74 kg)    ASSESSMENT AND PLAN:  1. Atrial fib: I have reviewed his medication list and bag, which he brings with him. He has a bottle of 100 mg metoprolol, 50 mg metoprolol and 25 mg metoprolol, which he states he is taking twice a day. I have removed the 100 mg bottle and left the 50 mg and 25 mg bottles. He will be placed on metoprolol 75 mg BID as directed on last appointment. Will continue Eliquis. CBC will be ordered.   2. NICM: Last EF of 25% per echo 05/04/2016. Uncertain if  patient is currently taking medication. His son is with him and will go over the medications in the bottle with what his medication trays have in them. For now he will continue Entresto 2426, metoprolol 75 mg twice a day, Lasix 20 mg when necessary with potassium when necessary, isosorbide. Blood pressure is not optimal for reduced EF at 130/70. May need to increase Entresto on next office visit if he is indeed not taking medication as directed, re: metoprolol.  Consideration for [facility] and evaluation and help should be discussed on next office visit if he continues to have difficulty with medication.    Current medicines are reviewed at  length with the patient today.    Labs/ tests ordered today include: BMET CBC.  Phill Myron. West Pugh, ANP, AACC   02/03/2017 3:24 PM    [facility] [address] [place], [place] [address] Phone: [phone]; Fax: [phone]

## 2017-02-03 ENCOUNTER — Ambulatory Visit (INDEPENDENT_AMBULATORY_CARE_PROVIDER_SITE_OTHER): Payer: Medicare FFS | Admitting: Adult Health

## 2017-02-03 ENCOUNTER — Encounter: Payer: Self-pay | Admitting: Adult Health

## 2017-02-03 ENCOUNTER — Other Ambulatory Visit (HOSPITAL_COMMUNITY)
Admission: RE | Admit: 2017-02-03 | Discharge: 2017-02-03 | Disposition: A | Discharge status: Home / Self Care | Payer: Medicare FFS | Source: Ambulatory Visit | Attending: Adult Health | Admitting: Adult Health

## 2017-02-03 VITALS — BP 130/70 | HR 86 | Ht 71.0 in | Wt 163.0 lb

## 2017-02-03 DIAGNOSIS — I480 Paroxysmal atrial fibrillation: Secondary | ICD-10-CM

## 2017-02-03 DIAGNOSIS — R0602 Shortness of breath: Secondary | ICD-10-CM | POA: Diagnosis present

## 2017-02-03 DIAGNOSIS — I10 Essential (primary) hypertension: Secondary | ICD-10-CM

## 2017-02-03 DIAGNOSIS — I5022 Chronic systolic (congestive) heart failure: Secondary | ICD-10-CM | POA: Diagnosis not present

## 2017-02-03 LAB — CBC WITH DIFFERENTIAL/PLATELET
BASOS ABS: 0 10*3/uL (ref 0.0–0.1)
BASOS PCT: 1 %
EOS ABS: 0.1 10*3/uL (ref 0.0–0.7)
Eosinophils Relative: 3 %
HEMATOCRIT: 40.6 % (ref 39.0–52.0)
HEMOGLOBIN: 13.3 g/dL (ref 13.0–17.0)
Lymphocytes Relative: 35 %
Lymphs Abs: 1 10*3/uL (ref 0.7–4.0)
MCH: 31.7 pg (ref 26.0–34.0)
MCHC: 32.8 g/dL (ref 30.0–36.0)
MCV: 96.9 fL (ref 78.0–100.0)
Monocytes Absolute: 0.3 10*3/uL (ref 0.1–1.0)
Monocytes Relative: 11 %
NEUTROS ABS: 1.4 10*3/uL — AB (ref 1.7–7.7)
NEUTROS PCT: 50 %
Platelets: 121 10*3/uL — ABNORMAL LOW (ref 150–400)
RBC: 4.19 MIL/uL — AB (ref 4.22–5.81)
RDW: 14.2 % (ref 11.5–15.5)
WBC: 2.8 10*3/uL — AB (ref 4.0–10.5)

## 2017-02-03 LAB — BASIC METABOLIC PANEL
ANION GAP: 7 (ref 5–15)
BUN: 13 mg/dL (ref 6–20)
CALCIUM: 9 mg/dL (ref 8.9–10.3)
CO2: 22 mmol/L (ref 22–32)
Chloride: 103 mmol/L (ref 101–111)
Creatinine, Ser: 1.32 mg/dL — ABNORMAL HIGH (ref 0.61–1.24)
GFR, EST AFRICAN AMERICAN: 56 mL/min — AB (ref >60)
GFR, EST NON AFRICAN AMERICAN: 49 mL/min — AB (ref >60)
Glucose, Bld: 100 mg/dL — ABNORMAL HIGH (ref 65–99)
Potassium: 4.4 mmol/L (ref 3.5–5.1)
SODIUM: 132 mmol/L — AB (ref 135–145)

## 2017-02-03 NOTE — Patient Instructions (Signed)
Medication Instructions:  Your physician recommends that you continue on your current medications as directed. Please refer to the Current Medication list given to you today.   Labwork: Your physician recommends that you return for lab work in: Today    Testing/Procedures: NONE   Follow-Up: Your physician recommends that you schedule a follow-up appointment with Dr. Harl Bowie    Any Other Special Instructions Will Be Listed Below (If Applicable).     If you need a refill on your cardiac medications before your next appointment, please call your pharmacy.  Thank you for choosing [facility]!

## 2017-02-10 ENCOUNTER — Other Ambulatory Visit: Payer: Self-pay | Admitting: Cardiology

## 2017-02-14 ENCOUNTER — Other Ambulatory Visit: Payer: Self-pay | Admitting: Cardiology

## 2017-02-16 ENCOUNTER — Telehealth: Payer: Self-pay | Admitting: Cardiology

## 2017-02-16 MED ORDER — SACUBITRIL-VALSARTAN 24-26 MG PO TABS: 1.0000 | ORAL_TABLET | Freq: Two times a day (BID) | ORAL | 0 refills | Status: DC

## 2017-02-16 NOTE — Telephone Encounter (Signed)
Patient went to [facility] to pick up his heart medication and was told that he would owe around $300.00. Patient's son Muaz Shorey would like to speak with someone.  [phone]

## 2017-02-16 NOTE — Telephone Encounter (Signed)
Pt currently in doughnut hole with insurance and says Thomas Costa would be $40/month. Pt son will come by tomorrow to pick up samples and pt assistance form.

## 2017-02-23 ENCOUNTER — Telehealth: Payer: Self-pay | Admitting: Cardiology

## 2017-02-23 MED ORDER — SACUBITRIL-VALSARTAN 24-26 MG PO TABS: 1.0000 | ORAL_TABLET | Freq: Two times a day (BID) | ORAL | 0 refills | Status: DC

## 2017-02-23 NOTE — Addendum Note (Signed)
Addended by: Acquanetta Chain on: 02/23/2017 02:12 PM   Modules accepted: Orders

## 2017-02-23 NOTE — Telephone Encounter (Signed)
Advised son we did not have samples at this time. Patient waiting on insurance to kick in. Would take about 15-20 days.

## 2017-02-23 NOTE — Telephone Encounter (Signed)
Patient going to use 30 day free card

## 2017-02-23 NOTE — Telephone Encounter (Signed)
Thomas Costa -son is calling requesting samples of Entresto

## 2017-04-22 ENCOUNTER — Ambulatory Visit: Payer: Medicare FFS | Admitting: Cardiology

## 2017-05-16 ENCOUNTER — Ambulatory Visit: Payer: Medicare FFS | Admitting: Cardiology

## 2017-05-20 ENCOUNTER — Ambulatory Visit: Payer: Medicare FFS | Admitting: Cardiology

## 2017-05-23 ENCOUNTER — Encounter: Payer: Self-pay | Admitting: Cardiology

## 2017-05-23 ENCOUNTER — Ambulatory Visit: Payer: Medicare FFS | Admitting: Cardiology

## 2017-05-23 VITALS — BP 116/72 | HR 69 | Ht 71.0 in | Wt 167.0 lb

## 2017-05-23 DIAGNOSIS — I5022 Chronic systolic (congestive) heart failure: Secondary | ICD-10-CM

## 2017-05-23 DIAGNOSIS — I480 Paroxysmal atrial fibrillation: Secondary | ICD-10-CM | POA: Diagnosis not present

## 2017-05-23 DIAGNOSIS — I340 Nonrheumatic mitral (valve) insufficiency: Secondary | ICD-10-CM | POA: Diagnosis not present

## 2017-05-23 NOTE — Patient Instructions (Addendum)
  Your physician recommends that you schedule a follow-up appointment in:  4 months with Dr Harl Bowie   Your physician recommends that you continue on your current medications as directed. Please refer to the Current Medication list given to you today.    If you need a refill on your cardiac medications before your next appointment, please call your pharmacy.    No lab work or tests ordered today.      Thank you for choosing [facility] !

## 2017-05-23 NOTE — Progress Notes (Signed)
Clinical Summary [name] is a 84 y.o.male seen today for follow up of the following medical problems.   1. Afib - long history of afib according to notes - recent afib with RVR during 09/2015 admission at [facility] - previous hematemsis when on anticoagulant in the past, has not been on  - mention of sick sinus syndrome in the past, the specificis are not well described. .    - no recent palpitations - no bleeding on anticoag  2. GI bleedinghistory - several old records reviewed to better understand his GI bleeding history including records from [place] and [place] - In 2010 admitted to [facility] with GI bleed. Received 2 units of pRBCs. EGD and colonoscopy unremarkable.  In 2012 patient was in [facility] with hematocheazia. He had EGD and Colonoscopy by Dr.Oneil and Capsule endoscopy by Dr.Fein which was negative. He had a bleeding scan which was negative. Similar things happened few years ago when he went to [facility] and w/up was negative.  He bleeding restarted and he is having hematocheazia again x 2  He was originally on coumadin for afib - Pt had no further GIB while in hospital and had stable Hgb. EGD/colo were negative. Capsule study done and results pending. Dr. Curly [place] to call pt w/ results. Pt recommended to remain off coumadin given h/o GIB with no source found at this time. Ok to d/c home today. - admit 02/2012 with GI bleed while off anticoag, thought to be diverticular bleed. Received 2 units of pRBCs. Colonoscopy with large clots in cecum.   - has tolerated anticoagulation thus far without bleeding troubles.  - denies any recent bleeding  3. History of TIA - acute episode of slurred speech, facial droop, confusion during recent admission at [facility] - now on eliquis.   - no recent neuro symptoms.     4. Chronic systolic HF - admit 0/3009 with CHF to [facility], appears to be new diagnosis -09/2015 echo [facility] LVEF 20-25%,  moderate AI, modertate to severe MR,  - clinic note from 2013 mentions LVEF 55-60%, moderate MR.  - EKG LBBB of unknown duration.  - cath 11/2015 with mild nonobstructive CAD, CI 2.2, mean PA 23, PCWP 11 - Jan 2018 echo [facility]: LVEF 25-30%, mild RV dysfunction, severe MR, mod TR, restrictive diastolic function   - home weight stable around 161 lbs. No recent edema, no SOB - compliant with meds   5. 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  - s/p CT guided biopsy  - followed by CT surgery and oncology   6Mitral regurgitation - long history of MR followed by previous cardiologist - by echo 09/2015 reported moderate to severe - echo Jan 2018 [facility] severe MR.   - no recent SOB/DOE/LE edema   7. 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 Unclear if missed dilated portion.  - CT PE [facility] 09/2015 with mildy dilated ascedning aorta at 4.2 cm.     Past Medical History:  Diagnosis Date  . Atrial fibrillation, permanent ([place])   . Chronic anemia   . Chronic leukopenia   . Congestive heart failure (CHF) ([place])   . Diabetes ([place])   . GERD (gastroesophageal reflux disease)   . Hyperlipidemia   . Hypertension   . Pancytopenia ([place])      No Known Allergies   Current  Outpatient Medications  Medication Sig Dispense Refill  . allopurinol (ZYLOPRIM) 300 MG tablet Take 300 mg by mouth daily.     [name] docusate sodium (COLACE) 100 MG capsule Take 100-200 mg by mouth daily as needed for mild constipation.    [name] ELIQUIS 5 MG TABS tablet TAKE 1 TABLET BY MOUTH TWICE DAILY 60 tablet 3  . folic acid (FOLVITE) 1 MG tablet Take 1 mg by mouth daily.     . furosemide (LASIX) 20 MG tablet Take 1 tablet (20 mg total) by mouth daily as needed. 90 tablet 1  . ibuprofen (ADVIL,MOTRIN) 200 MG tablet Take 200 mg by mouth every 6 (six) hours as  needed.    . isosorbide mononitrate (IMDUR) 30 MG 24 hr tablet Take 15 mg by mouth every morning.    . magnesium oxide (MAG-OX) 400 MG tablet Take 400 mg by mouth 2 (two) times daily.    . metoprolol succinate (TOPROL XL) 25 MG 24 hr tablet Take 1 tablet (25 mg total) by mouth daily. 90 tablet 3  . metoprolol succinate (TOPROL-XL) 100 MG 24 hr tablet Take 150 mg by mouth 2 (two) times daily. Take with or immediately following a meal.    . metoprolol succinate (TOPROL-XL) 50 MG 24 hr tablet Take 1 tablet daily 90 tablet 3  . omeprazole (PRILOSEC) 40 MG capsule Take 40 mg by mouth daily.     . potassium chloride SA (K-DUR,KLOR-CON) 20 MEQ tablet Take 40 mEq by mouth 2 (two) times daily.    . sacubitril-valsartan (ENTRESTO) 24-26 MG Take 1 tablet by mouth 2 (two) times daily. 60 tablet 0   No current facility-administered medications for this visit.      Past Surgical History:  Procedure Laterality Date  . 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 CARDIAC CATHETERIZATION N/A 12/03/2015   Procedure: Right/Left Heart Cath and Coronary Angiography;  Surgeon: Peter M Martinique, MD;  Location: [place] [facility];  Service: Cardiovascular;  Laterality: N/A;  . PERIPHERAL VASCULAR CATHETERIZATION N/A 12/03/2015   Procedure: Thoracic Aortogram;  Surgeon: Peter M Martinique, MD;  Location: [place] [facility];  Service: Cardiovascular;  Laterality: N/A;     No Known Allergies    Family History  Family history unknown: Yes     Social History [name] reports that he quit smoking about 22 years ago. His smoking use included cigarettes. He started smoking about 67 years ago. He has a 23.00 pack-year smoking history. he has never used smokeless tobacco. [name] has no alcohol history on file.   Review of Systems CONSTITUTIONAL: No weight loss, fever, chills, weakness or fatigue.  HEENT: Eyes: No visual loss, blurred vision,  double vision or yellow sclerae.No hearing loss, sneezing, congestion, runny nose or sore throat.  SKIN: No rash or itching.  CARDIOVASCULAR: per hpi RESPIRATORY: No shortness of breath, cough or sputum.  GASTROINTESTINAL: No anorexia, nausea, vomiting or diarrhea. No abdominal pain or blood.  GENITOURINARY: No burning on urination, no polyuria NEUROLOGICAL: No headache, dizziness, syncope, paralysis, ataxia, numbness or tingling in the extremities. No change in bowel or bladder control.  MUSCULOSKELETAL: No muscle, back pain, joint pain or stiffness.  LYMPHATICS: No enlarged nodes. No history of splenectomy.  PSYCHIATRIC: No history of depression or anxiety.  ENDOCRINOLOGIC: No reports of sweating, cold or heat intolerance. No polyuria or polydipsia.  [name]   Physical Examination Vitals:   05/23/17 1328  BP:  116/72  Pulse: 69  SpO2: 98%   Vitals:   05/23/17 1328  Weight: 167 lb (75.8 kg)  Height: 5\' 11"  (1.803 m)    Gen: resting comfortably, no acute distress HEENT: no scleral icterus, pupils equal round and reactive, no palptable cervical adenopathy,  CV: RRR, 2/6 systolic murmur at apex, no jvd Resp: Clear to auscultation bilaterally GI: abdomen is soft, non-tender, non-distended, normal bowel sounds, no hepatosplenomegaly MSK: extremities are warm, no edema.  Skin: warm, no rash Neuro:  no focal deficits Psych: appropriate affect   Diagnostic Studies 11/2015 LHC/RHC  Prox LAD to Mid LAD lesion, 20 %stenosed.  There is severe left ventricular systolic dysfunction.  The left ventricular ejection fraction is less than 25% by visual estimate.  There is mild (2+) mitral regurgitation.  LV end diastolic pressure is normal.  LV end diastolic pressure is normal.  1. Minor nonobstructive CAD 2. Severe LV enlargement with markedly reduced LV systolic function. 3. Moderate dilation of proximal aorta. 4. Normal right heart pressures, normal LV filling pressures.  5. Mild  MR Aortic Root: The aortic root displays moderate dilation. Plan: medical management.    Jan 2018 Echo [facility] LVEF 25-30%, severe MR.      Assessment and Plan  1. PAF -- no symptoms, continue current meds - CHADS2Vasc score is 6, continue anticoagulation.   2. Chronic systolic HF - no recent symptoms - prior orthostatic symptoms limit medical therapy - he is a poor ICD candidate given he is NICM with advanced age, mediastinal mass  3. Mitral regurgitation - no recent symptoms - continue to monitr    F/u 64months       Arnoldo Lenis, M.D.,

## 2017-05-26 ENCOUNTER — Encounter: Payer: Self-pay | Admitting: Cardiology

## 2017-06-12 ENCOUNTER — Other Ambulatory Visit: Payer: Self-pay | Admitting: Cardiology

## 2017-07-29 ENCOUNTER — Telehealth: Payer: Self-pay

## 2017-07-29 NOTE — Progress Notes (Signed)
[name]    HEMATOLOGY/ONCOLOGY CLINIC NOTE  Date of Service: 08/01/17  Patient Care Team: Lonia Mad, MD as PCP - General (Internal Medicine)  Surgeon Dr. Valentino Saxon  Cardiology : Dr.Branch  Son - Thomas Costa [phone]    CHIEF COMPLAINTS/PURPOSE OF CONSULTATION:  Abdominal Carcinoid tumor Newly diagnosed thymoma  HISTORY OF PRESENTING ILLNESS:   Thomas Costa. is a wonderful 84 y.o. male who has been referred to [place] by Dr Tressie Stalker for continued care for his abdominal carcinoid .   patient presented in early March 2017 with abdominal pain, 30 pound weight loss over 6 months and increasing indigestion and abdominal pain. He also had nausea and vomiting. Patient was admitted to the hospital and had a CT of the abdomen that showed possible tumor in the mesentery. He was taken to surgery by Dr. Anthony Sar on 06/27/2015 and had resection of all visible tumor. He had partial small bowel resection a lymph node biopsy and he was found to have a tumor implant in the mesenteric tissue. The primary was 1.1 x 0.5-0.4 cm, he had one lymph node found to be involved and he had a 2.0 cm tumoral implant in the mesenteric tissue which was apparently resected. Patient had no symptoms of carcinoid syndrome.  He was seen in clinic on 07/21/2015 by Dr. Tressie Stalker at [facility] in [place], [place] and baseline labs were done at the time. CBC was noted to be normal with no anemia. CMP was within normal limits with no liver function abnormalities.  The actual pathology report and the baseline lab results were not available to [place].  Patient notes he healed well from his abdominal surgery. He has been taking in short and his oral appetite has improved and he feels that he is gaining back some weight. He notes that at its lowest his weight was 135 pounds and he is back up to 157.4 pounds now. Patient reports that his good baseline is 170 pounds.  Patient has significant systolic CHF and follows up with cardiology he had  a recent echocardiogram 10/20/2015 that showed left ventricular chamber size is moderately dilated, moderate concentric LVH with severely reduced left ventricular systolic function with an estimated ejection fraction of 20-25% . Also noted to have moderate to severe mitral regurgitation and moderate aortic regurgitation.  He presented to [facility] with acute dyspnea on 10/20/2015 and had a CTA of the chest with contrast that was negative for pulmonary embolism but an anterior mediastinal mass measuring 4.4 x 6.5 x 5.4 cm was noted with a few mildly prominent mediastinal lymph nodes measuring 1.5 cm and a mildly prominent right hilar lymph node. Considerations were lymphoma versus teratoma versus thymoma. Liver lesions were also apparently noted. No significant skeletal lesions noted. Bilateral pleural effusions right greater than left.  Prior to this the patient had a CT of the abdomen and pelvis with contrast on 10/18/2015 at [facility] for evaluation of generalized abdominal pain intermittently for the last 3 months. This showed postoperative changes in the abdomen with evidence of a partial small bowel resection and removal of the mediastinal mass. Small lymph nodes in the mesentery. Numerous low-density structures in the liver which probably represent cysts. Index lesion in the posterior left hepatic lobe measures 2.4 cm and stable. Noted to have gallbladder distention with some new pericholecystic fluid.  The CT scan result was provided to [place] after the patient's clinical visit. Images were requested and disc.   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 The patient's last visit with [place] was on 02/03/16. He is accompanied today by his two sons. The pt reports that he is doing well overall. The pt notes that the reason he missed his last visit was because of a stroke.   The pt reports that he recently had a visit with Dr. Harl Costa  with cardiology who referred the pt back to our care. The pt's son notes that the pt was referred back to our care due to abnormal labs.    The pt notes that he continues to be able to do everything he wants to do in life and doesn't have any new concerns.   Lab results (07/19/17) of CBC is as follows: all values are WNL except for RDW at 45.0, WBC at 2.4k, RBC at 4.33, Hgb at 12.9, MCHC at 31.9, Segmented Neutrophil Abs at 1.45k, Lymphs Abs at 0.51k.   On review of systems, pt reports occasional stomach pains, and denies losing weight, chest tightness, SOB, skin rashes, uncontrolled diarrhea, back pain, pain along the spine, blood in the stools, black stools, leg swelling, and any other symptoms.    MEDICAL HISTORY:  Past Medical History:  Diagnosis Date  . Atrial fibrillation, permanent ([place])   . Chronic anemia   . Chronic leukopenia   . Congestive heart failure (CHF) ([place])   . Diabetes ([place])   . GERD (gastroesophageal reflux disease)   . Hyperlipidemia   . Hypertension   . 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 CARDIAC CATHETERIZATION N/A 12/03/2015   Procedure: Right/Left Heart Cath and Coronary Angiography;  Surgeon: Peter M Martinique, MD;  Location: [facility];  Service: Cardiovascular;  Laterality: N/A;  . PERIPHERAL VASCULAR CATHETERIZATION N/A 12/03/2015   Procedure: Thoracic Aortogram;  Surgeon: Peter M Martinique, MD;  Location: [facility];  Service: Cardiovascular;  Laterality: N/A;    SOCIAL HISTORY: Social History   Socioeconomic History  . Marital status: Widowed    Spouse name: Not on file  . Number of children: Not on file  . Years of education: Not on file  . Highest education level: Not on file  Occupational History    . Not on file  Social Needs  . Financial resource strain: Not on file  . Food insecurity:    Worry: Not on file    Inability: Not on file  . Transportation needs:    Medical: Not on file    Non-medical: Not on file  Tobacco Use  . Smoking status: Former Smoker    Packs/day: 0.50    Years: 46.00    Pack years: 23.00    Types: Cigarettes    Start date: 10/27/1949    Last attempt to quit: 04/27/1995    Years since quitting: 22.2  . Smokeless tobacco: Never Used  Substance and Sexual Activity  . Alcohol use: Not on file  . Drug use: Not on file  . Sexual activity: Not on file  Lifestyle  . Physical activity:    Days per week: Not on file    Minutes per session: Not on file  . Stress: Not on file  Relationships  . Social connections:    Talks on phone: Not on file  Gets together: Not on file    Attends religious service: Not on file    Active member of club or organization: Not on file    Attends meetings of clubs or organizations: Not on file    Relationship status: Not on file  . Intimate partner violence:    Fear of current or ex partner: Not on file    Emotionally abused: Not on file    Physically abused: Not on file    Forced sexual activity: Not on file  Other Topics Concern  . Not on file  Social History Narrative  . Not on file    FAMILY HISTORY: Family History  Family history unknown: Yes  Patient denies any family history of cancers   ALLERGIES:  has No Known Allergies.  MEDICATIONS:  Current Outpatient Medications  Medication Sig Dispense Refill  . allopurinol (ZYLOPRIM) 300 MG tablet Take 300 mg by mouth daily.     [name] docusate sodium (COLACE) 100 MG capsule Take 100-200 mg by mouth daily as needed for mild constipation.    [name] ELIQUIS 5 MG TABS tablet TAKE 1 TABLET BY MOUTH TWICE DAILY 60 tablet 3  . folic acid (FOLVITE) 1 MG tablet Take 1 mg by mouth daily.     . furosemide (LASIX) 20 MG tablet Take 1 tablet (20 mg total) by mouth daily as needed. 90  tablet 1  . ibuprofen (ADVIL,MOTRIN) 200 MG tablet Take 200 mg by mouth every 6 (six) hours as needed.    . isosorbide mononitrate (IMDUR) 30 MG 24 hr tablet Take 15 mg by mouth every morning.    . magnesium oxide (MAG-OX) 400 MG tablet Take 400 mg by mouth 2 (two) times daily.    . metoprolol succinate (TOPROL-XL) 25 MG 24 hr tablet Take 25 mg by mouth 2 (two) times daily.    [name] omeprazole (PRILOSEC) 40 MG capsule Take 40 mg by mouth daily.     . potassium chloride SA (K-DUR,KLOR-CON) 20 MEQ tablet Take 40 mEq by mouth 2 (two) times daily.    . sacubitril-valsartan (ENTRESTO) 24-26 MG Take 1 tablet by mouth 2 (two) times daily. 60 tablet 0   No current facility-administered medications for this visit.     REVIEW OF SYSTEMS:    10 Point review of Systems was done is negative except as noted above.   PHYSICAL EXAMINATION: ECOG PERFORMANCE STATUS: 2 - Symptomatic, <50% confined to bed  . Vitals:   08/01/17 1440  BP: 118/84  Pulse: 75  Resp: 18  Temp: 97.9 F (36.6 C)  SpO2: 100%   Filed Weights   08/01/17 1440  Weight: 166 lb 9.6 oz (75.6 kg)   .Body mass index is 23.24 kg/m.  GENERAL:alert, in no acute distress and comfortable SKIN: no acute rashes, no significant lesions EYES: conjunctiva are pink and non-injected, sclera anicteric OROPHARYNX: MMM, no exudates, no oropharyngeal erythema or ulceration NECK: supple, + JVD LYMPH:  no palpable lymphadenopathy in the cervical, axillary or inguinal regions LUNGS: Bilateral decreased breath sounds no rales no rhonchi HEART: irregular rhythm, systolic murmur and mitral and aortic areas ABDOMEN:  normoactive bowel sounds , non tender, not distended, healed surgical scar. Extremity: Bilateral 1+ pedal edema PSYCH: alert & oriented x 3 with fluent speech NEURO: no focal motor/sensory deficits   LABORATORY DATA:  I have reviewed the data as listed  . CBC Latest Ref Rng & Units 02/03/2017 02/03/2016 12/31/2015  WBC 4.0 - 10.5  K/uL 2.8(L) 2.7(L) 2.3(L)  Hemoglobin  13.0 - 17.0 g/dL 13.3 12.0(L) 11.4(L)  Hematocrit 39.0 - 52.0 % 40.6 36.2(L) 36.1(L)  Platelets 150 - 400 K/uL 121(L) 99(L) 105(L)   . CBC    Component Value Date/Time   WBC 2.8 (L) 02/03/2017 1610   RBC 4.19 (L) 02/03/2017 1610   HGB 13.3 02/03/2017 1610   HGB 12.0 (L) 02/03/2016 1404   HCT 40.6 02/03/2017 1610   HCT 36.2 (L) 02/03/2016 1404   PLT 121 (L) 02/03/2017 1610   PLT 99 (L) 02/03/2016 1404   MCV 96.9 02/03/2017 1610   MCV 85.2 02/03/2016 1404   MCH 31.7 02/03/2017 1610   MCHC 32.8 02/03/2017 1610   RDW 14.2 02/03/2017 1610   RDW 16.6 (H) 02/03/2016 1404   LYMPHSABS 1.0 02/03/2017 1610   LYMPHSABS 0.7 (L) 02/03/2016 1404   MONOABS 0.3 02/03/2017 1610   MONOABS 0.3 02/03/2016 1404   EOSABS 0.1 02/03/2017 1610   EOSABS 0.1 02/03/2016 1404   BASOSABS 0.0 02/03/2017 1610   BASOSABS 0.0 02/03/2016 1404   . CMP Latest Ref Rng & Units 02/03/2017 02/03/2016 12/03/2015  Glucose 65 - 99 mg/dL 100(H) 99 106(H)  BUN 6 - 20 mg/dL 13 14.4 13  Creatinine 0.61 - 1.24 mg/dL 1.32(H) 1.1 1.36(H)  Sodium 135 - 145 mmol/L 132(L) 140 137  Potassium 3.5 - 5.1 mmol/L 4.4 2.8(LL) 3.2(L)  Chloride 101 - 111 mmol/L 103 - 104  CO2 22 - 32 mmol/L 22 24 24   Calcium 8.9 - 10.3 mg/dL 9.0 8.8 8.6(L)  Total Protein 6.4 - 8.3 g/dL - 6.9 -  Total Bilirubin 0.20 - 1.20 mg/dL - 0.71 -  Alkaline Phos 40 - 150 U/L - 84 -  AST 5 - 34 U/L - 17 -  ALT 0 - 55 U/L - 9 -   .    RADIOGRAPHIC STUDIES: I have personally reviewed the radiological images as listed and agreed with the findings in the report.  PET/CT 11/24/2015: IMPRESSION: 4.2 x 6.6 cm hypermetabolic anterior mediastinal mass. Primary differential considerations include lymphoma or thymoma.  10 mm short axis hypermetabolic right axillary node, raising the possibility of nodal metastasis. Additional mediastinal nodes do not demonstrate appreciable hypermetabolism.  2.2 x 1.9 cm hypermetabolic  polypoid lesion along the medial wall of the left maxillary sinus, possibly reflecting a benign or malignant maxillary sinus polyp. Metastatic disease to this location would be considered unusual. Consider maxillofacial MRI with/without contrast for further characterization as clinically warranted.   Electronically Signed   By: Julian Hy M.D.   On: 11/24/2015 17:24   ASSESSMENT & PLAN:   84 year old African-American male with multiple medical comorbidities and severe systolic CHF with  1) ?Stage III small bowel carcinoid status post resection by Dr. Anthony Sar on 06/27/2015 at [facility]. Noted to have one positive lymph node and extramural nodule as per outside report. Chromogranin level borderline elevated to 9 , nl serotonin. No overt evidence of recurrent neuro-endocrine tumor at this time.  2) anterior mediastinal mass -4.4 x 6.5 x 5.4 cm was noted with a few mildly prominent mediastinal lymph nodes. Noted to FDG avid on PET/CT scan. CT guided biopsy consistent with thymoma. This was incidentally noted on imaging has remained asymptomatic . No overt associated paraneoplastic presentation at this time.  3) thrombocytopenia mild PLT 99k. No evidence of bleeding - monitor  Plan -Discussed pt labwork today for which he was referred back to our care; WBC at 2.4k and Hgb at 12.9.  -Discussed that a CT scan  requires IV dye which his kidneys would not be able to handle.  -Discussed that the patient's most recent labs are not too worrisome but if the pt would like more information on the status of his tumor, we could order a PET scan if it was in keeping with his goals of care. -Offered the pt to have a PET scan, and he noted that he wants to have this.  -Will order labs today   PET/CT in 2 weeks Labs in 2weeks  RTC with Dr Irene Limbo in 3 weeks   All of the patients questions were answered with apparent satisfaction. The patient knows to call the clinic with any problems,  questions or concerns.  . The total time spent in the appointment was 20 minutes and more than 50% was on counseling and direct patient cares.   Sullivan Lone MD [place] AAHIVMS [facility] Hematology/Oncology Physician [place]  (Office):       [phone] (Work cell):  [phone] (Fax):           [phone]  This document serves as a record of services personally performed by Sullivan Lone, MD. It was created on his behalf by Baldwin Jamaica, a trained medical scribe. The creation of this record is based on the scribe's personal observations and the provider's statements to them.   .I have reviewed the above documentation for accuracy and completeness, and I agree with the above. Brunetta Genera MD MS

## 2017-07-29 NOTE — Telephone Encounter (Signed)
Pt called to request return call. Upon calling pt he stated, "They have already scheduled my appointments. That is what I called about." Pt said thank you for the f/u call.

## 2017-08-01 ENCOUNTER — Inpatient Hospital Stay: Payer: Medicare FFS | Attending: Hematology | Admitting: Hematology

## 2017-08-01 ENCOUNTER — Encounter: Payer: Self-pay | Admitting: Hematology

## 2017-08-01 VITALS — BP 118/84 | HR 75 | Temp 97.9°F | Resp 18 | Ht 71.0 in | Wt 166.6 lb

## 2017-08-01 DIAGNOSIS — I10 Essential (primary) hypertension: Secondary | ICD-10-CM | POA: Diagnosis not present

## 2017-08-01 DIAGNOSIS — Z87891 Personal history of nicotine dependence: Secondary | ICD-10-CM | POA: Insufficient documentation

## 2017-08-01 DIAGNOSIS — D696 Thrombocytopenia, unspecified: Secondary | ICD-10-CM | POA: Diagnosis not present

## 2017-08-01 DIAGNOSIS — K219 Gastro-esophageal reflux disease without esophagitis: Secondary | ICD-10-CM | POA: Diagnosis not present

## 2017-08-01 DIAGNOSIS — D150 Benign neoplasm of thymus: Secondary | ICD-10-CM | POA: Diagnosis not present

## 2017-08-01 DIAGNOSIS — E119 Type 2 diabetes mellitus without complications: Secondary | ICD-10-CM | POA: Diagnosis not present

## 2017-08-01 DIAGNOSIS — C7A019 Malignant carcinoid tumor of the small intestine, unspecified portion: Secondary | ICD-10-CM | POA: Diagnosis not present

## 2017-08-01 DIAGNOSIS — C7B09 Secondary carcinoid tumors of other sites: Secondary | ICD-10-CM

## 2017-08-01 DIAGNOSIS — I482 Chronic atrial fibrillation: Secondary | ICD-10-CM | POA: Diagnosis not present

## 2017-08-01 DIAGNOSIS — E785 Hyperlipidemia, unspecified: Secondary | ICD-10-CM | POA: Insufficient documentation

## 2017-08-01 DIAGNOSIS — Z79899 Other long term (current) drug therapy: Secondary | ICD-10-CM | POA: Diagnosis not present

## 2017-08-01 DIAGNOSIS — J90 Pleural effusion, not elsewhere classified: Secondary | ICD-10-CM | POA: Diagnosis not present

## 2017-08-01 DIAGNOSIS — C7A00 Malignant carcinoid tumor of unspecified site: Secondary | ICD-10-CM

## 2017-08-01 DIAGNOSIS — R222 Localized swelling, mass and lump, trunk: Secondary | ICD-10-CM | POA: Diagnosis not present

## 2017-08-01 DIAGNOSIS — D4989 Neoplasm of unspecified behavior of other specified sites: Secondary | ICD-10-CM

## 2017-08-02 ENCOUNTER — Telehealth: Payer: Self-pay | Admitting: Hematology

## 2017-08-02 NOTE — Telephone Encounter (Signed)
Per 4/8 no los °

## 2017-08-09 ENCOUNTER — Telehealth: Payer: Self-pay

## 2017-08-09 NOTE — Telephone Encounter (Signed)
Per 4/15 done by [place]

## 2017-08-17 ENCOUNTER — Ambulatory Visit (HOSPITAL_COMMUNITY)
Admission: RE | Admit: 2017-08-17 | Discharge: 2017-08-17 | Disposition: A | Discharge status: Home / Self Care | Payer: Medicare FFS | Source: Ambulatory Visit | Attending: Hematology | Admitting: Hematology

## 2017-08-17 DIAGNOSIS — D150 Benign neoplasm of thymus: Secondary | ICD-10-CM | POA: Diagnosis not present

## 2017-08-17 DIAGNOSIS — N281 Cyst of kidney, acquired: Secondary | ICD-10-CM | POA: Diagnosis not present

## 2017-08-17 DIAGNOSIS — D4989 Neoplasm of unspecified behavior of other specified sites: Secondary | ICD-10-CM

## 2017-08-17 DIAGNOSIS — I700 Atherosclerosis of aorta: Secondary | ICD-10-CM | POA: Insufficient documentation

## 2017-08-17 LAB — GLUCOSE, CAPILLARY: Glucose-Capillary: 86 mg/dL (ref 65–99)

## 2017-08-17 MED ORDER — FLUDEOXYGLUCOSE F - 18 (FDG) INJECTION
8.0000 | Freq: Once | INTRAVENOUS | Status: AC | PRN
  Administered 2017-08-17: 8 via INTRAVENOUS

## 2017-08-22 ENCOUNTER — Telehealth: Payer: Self-pay | Admitting: *Deleted

## 2017-08-22 NOTE — Telephone Encounter (Signed)
Voicemail received from patient's son Olon Russ requesting "Results of last Wednesday were to be called within two days.  Today is Monday."  Message forwarded to [facility] for further patient communication.

## 2017-08-23 ENCOUNTER — Telehealth: Payer: Self-pay

## 2017-08-23 NOTE — Telephone Encounter (Signed)
Pt son, Thomas Costa, called in expectation of PET scan results last Friday. Discussed with Dr. Irene Limbo, and shared that tumor has enlarged. There are other findings from the scan, but Dr. Irene Limbo would prefer to discuss with pt and son at next visit in order to talk about options for care. Pt son verbalized understanding and appreciation for the return call.

## 2017-08-25 ENCOUNTER — Telehealth: Payer: Self-pay

## 2017-08-25 NOTE — Telephone Encounter (Signed)
Completed by Alwyn Ren pet done  On 4/24. Per 4/15 in basket

## 2017-08-29 ENCOUNTER — Inpatient Hospital Stay: Payer: Medicare FFS | Attending: Hematology

## 2017-08-29 DIAGNOSIS — D150 Benign neoplasm of thymus: Secondary | ICD-10-CM | POA: Insufficient documentation

## 2017-08-29 DIAGNOSIS — D4989 Neoplasm of unspecified behavior of other specified sites: Secondary | ICD-10-CM

## 2017-08-29 DIAGNOSIS — D696 Thrombocytopenia, unspecified: Secondary | ICD-10-CM | POA: Insufficient documentation

## 2017-08-29 DIAGNOSIS — J90 Pleural effusion, not elsewhere classified: Secondary | ICD-10-CM | POA: Diagnosis not present

## 2017-08-29 DIAGNOSIS — K219 Gastro-esophageal reflux disease without esophagitis: Secondary | ICD-10-CM | POA: Insufficient documentation

## 2017-08-29 DIAGNOSIS — I10 Essential (primary) hypertension: Secondary | ICD-10-CM | POA: Insufficient documentation

## 2017-08-29 DIAGNOSIS — E785 Hyperlipidemia, unspecified: Secondary | ICD-10-CM | POA: Diagnosis not present

## 2017-08-29 DIAGNOSIS — Z79899 Other long term (current) drug therapy: Secondary | ICD-10-CM | POA: Diagnosis not present

## 2017-08-29 DIAGNOSIS — I700 Atherosclerosis of aorta: Secondary | ICD-10-CM | POA: Diagnosis not present

## 2017-08-29 DIAGNOSIS — E119 Type 2 diabetes mellitus without complications: Secondary | ICD-10-CM | POA: Insufficient documentation

## 2017-08-29 DIAGNOSIS — N281 Cyst of kidney, acquired: Secondary | ICD-10-CM | POA: Diagnosis not present

## 2017-08-29 DIAGNOSIS — C7A00 Malignant carcinoid tumor of unspecified site: Secondary | ICD-10-CM

## 2017-08-29 DIAGNOSIS — Z87891 Personal history of nicotine dependence: Secondary | ICD-10-CM | POA: Diagnosis not present

## 2017-08-29 DIAGNOSIS — I482 Chronic atrial fibrillation: Secondary | ICD-10-CM | POA: Diagnosis not present

## 2017-08-29 DIAGNOSIS — C7A019 Malignant carcinoid tumor of the small intestine, unspecified portion: Secondary | ICD-10-CM | POA: Diagnosis not present

## 2017-08-29 DIAGNOSIS — C7B09 Secondary carcinoid tumors of other sites: Secondary | ICD-10-CM

## 2017-08-29 LAB — CBC WITH DIFFERENTIAL (CANCER CENTER ONLY)
BASOS ABS: 0 10*3/uL (ref 0.0–0.1)
Basophils Relative: 0 %
EOS ABS: 0.1 10*3/uL (ref 0.0–0.5)
EOS PCT: 2 %
HCT: 39.2 % (ref 38.4–49.9)
Hemoglobin: 12.7 g/dL — ABNORMAL LOW (ref 13.0–17.1)
LYMPHS ABS: 0.8 10*3/uL — AB (ref 0.9–3.3)
Lymphocytes Relative: 29 %
MCH: 30.5 pg (ref 27.2–33.4)
MCHC: 32.4 g/dL (ref 32.0–36.0)
MCV: 94.2 fL (ref 79.3–98.0)
MONO ABS: 0.3 10*3/uL (ref 0.1–0.9)
Monocytes Relative: 11 %
Neutro Abs: 1.5 10*3/uL (ref 1.5–6.5)
Neutrophils Relative %: 58 %
PLATELETS: 136 10*3/uL — AB (ref 140–400)
RBC: 4.16 MIL/uL — AB (ref 4.20–5.82)
RDW: 14.5 % (ref 11.0–14.6)
WBC: 2.6 10*3/uL — AB (ref 4.0–10.3)

## 2017-08-29 LAB — CMP (CANCER CENTER ONLY)
ALK PHOS: 79 U/L (ref 40–150)
AST: 13 U/L (ref 5–34)
Albumin: 4 g/dL (ref 3.5–5.0)
Anion gap: 4 (ref 3–11)
BILIRUBIN TOTAL: 0.5 mg/dL (ref 0.2–1.2)
BUN: 16 mg/dL (ref 7–26)
CO2: 25 mmol/L (ref 22–29)
CREATININE: 1.41 mg/dL — AB (ref 0.70–1.30)
Calcium: 9.4 mg/dL (ref 8.4–10.4)
Chloride: 106 mmol/L (ref 98–109)
GFR, EST NON AFRICAN AMERICAN: 45 mL/min — AB (ref >60)
GFR, Est AFR Am: 52 mL/min — ABNORMAL LOW (ref >60)
GLUCOSE: 88 mg/dL (ref 70–140)
Potassium: 4.8 mmol/L (ref 3.5–5.1)
Sodium: 135 mmol/L — ABNORMAL LOW (ref 136–145)
TOTAL PROTEIN: 7.4 g/dL (ref 6.4–8.3)

## 2017-08-29 LAB — RETICULOCYTES
RBC.: 4.16 MIL/uL — ABNORMAL LOW (ref 4.20–5.82)
RETIC CT PCT: 0.7 % — AB (ref 0.8–1.8)
Retic Count, Absolute: 29.1 10*3/uL — ABNORMAL LOW (ref 34.8–93.9)

## 2017-08-31 LAB — CHROMOGRANIN A: CHROMOGRANIN A: 10 nmol/L — AB (ref 0–5)

## 2017-09-05 ENCOUNTER — Ambulatory Visit: Payer: Medicare FFS | Admitting: Hematology

## 2017-09-08 ENCOUNTER — Telehealth: Payer: Self-pay

## 2017-09-08 NOTE — Telephone Encounter (Signed)
Pt son, Marchia Bond, called back. Wanted to confirm pt appt date and time tomorrow. Labs and PET scan performed. Discussion with Dr. Irene Limbo tomorrow. Pt son verbalized understanding.

## 2017-09-08 NOTE — Progress Notes (Signed)
Thomas Costa    HEMATOLOGY/ONCOLOGY CLINIC NOTE  Date of Service: 09/09/2017  Patient Care Team: Lonia Mad, MD as PCP - General (Internal Medicine)  Surgeon Dr. Valentino Saxon  Cardiology : Dr.Branch  Son - Thomas Costa [phone]    CHIEF COMPLAINTS/PURPOSE OF CONSULTATION:  Abdominal Carcinoid tumor Newly diagnosed thymoma  HISTORY OF PRESENTING ILLNESS:   Thomas Costa. is a wonderful 84 y.o. male who has been referred to [place] by Dr Tressie Stalker for continued care for his abdominal carcinoid .   patient presented in early March 2017 with abdominal pain, 30 pound weight loss over 6 months and increasing indigestion and abdominal pain. He also had nausea and vomiting. Patient was admitted to the hospital and had a CT of the abdomen that showed possible tumor in the mesentery. He was taken to surgery by Dr. Anthony Sar on 06/27/2015 and had resection of all visible tumor. He had partial small bowel resection a lymph node biopsy and he was found to have a tumor implant in the mesenteric tissue. The primary was 1.1 x 0.5-0.4 cm, he had one lymph node found to be involved and he had a 2.0 cm tumoral implant in the mesenteric tissue which was apparently resected. Patient had no symptoms of carcinoid syndrome.  He was seen in clinic on 07/21/2015 by Dr. Tressie Stalker at [facility] in [place], [place] and baseline labs were done at the time. CBC was noted to be normal with no anemia. CMP was within normal limits with no liver function abnormalities.  The actual pathology report and the baseline lab results were not available to [place].  Patient notes he healed well from his abdominal surgery. He has been taking in short and his oral appetite has improved and he feels that he is gaining back some weight. He notes that at its lowest his weight was 135 pounds and he is back up to 157.4 pounds now. Patient reports that his good baseline is 170 pounds.  Patient has significant systolic CHF and follows up with cardiology he  had a recent echocardiogram 10/20/2015 that showed left ventricular chamber size is moderately dilated, moderate concentric LVH with severely reduced left ventricular systolic function with an estimated ejection fraction of 20-25% . Also noted to have moderate to severe mitral regurgitation and moderate aortic regurgitation.  He presented to [facility] with acute dyspnea on 10/20/2015 and had a CTA of the chest with contrast that was negative for pulmonary embolism but an anterior mediastinal mass measuring 4.4 x 6.5 x 5.4 cm was noted with a few mildly prominent mediastinal lymph nodes measuring 1.5 cm and a mildly prominent right hilar lymph node. Considerations were lymphoma versus teratoma versus thymoma. Liver lesions were also apparently noted. No significant skeletal lesions noted. Bilateral pleural effusions right greater than left.  Prior to this the patient had a CT of the abdomen and pelvis with contrast on 10/18/2015 at [facility] for evaluation of generalized abdominal pain intermittently for the last 3 months. This showed postoperative changes in the abdomen with evidence of a partial small bowel resection and removal of the mediastinal mass. Small lymph nodes in the mesentery. Numerous low-density structures in the liver which probably represent cysts. Index lesion in the posterior left hepatic lobe measures 2.4 cm and stable. Noted to have gallbladder distention with some new pericholecystic fluid.  The CT scan result was provided to [place] after the patient's clinical visit. Images were requested and disc.   INTERVAL HISTORY  [name] is here for  follow-up of his thymoma and h/o abdominal neuroendocrine tumor. The patient's last visit with [place] was on 08/01/2017. He is accompanied today by his two sons. He is doing well overall. No chest pain no shortness of breath, no abd pain or nausea or diarrhea.  Since his last visit to the office he had a restaging PET Scan on 08/17/2017 that  showed: Interval enlargement of anterior mediastinal mass compared with prior PET-CT 11/24/2015. The degree of hypermetabolic activity associated with this mass is similar to the previous study. Two new hypermetabolic right axillary lymph nodes, indeterminate in etiology. This would be an atypical pattern for isolated metastatic disease from the patient's thymoma. Low-level activity within mediastinal lymph nodes is similar to the previous study. Persistent hypermetabolic polypoid lesion in the left maxillary sinus. This could reflect a polyp, indolent neoplasm or chronic inflammation. Grossly stable incidental findings including hepatic and renal cysts and diffuse Aortic Atherosclerosis (ICD10-I70.0).  Previous labs from 08/29/2017 of CBC, CMP, Chromogranin A is as follows: all values are WNL except for reticulocytes with retic ct pct at 0.7, RBC at 4.16, retic count, absolute at 29.1. Chromogranin A at 10. CMP with Cr at 1.41. CBC with WBC at 2.6, Hgb at 12.7, PLT at 136K, and lymphs abs at 0.8.   On review of systems, he reports fullness sensation, lack of appetite. he denies bowel issues (he takes medications to help keep him regular), and any other symptoms. Pertinent positives are listed and detailed within the above HPI.    MEDICAL HISTORY:  Past Medical History:  Diagnosis Date  . Atrial fibrillation, permanent ([place])   . Chronic anemia   . Chronic leukopenia   . Congestive heart failure (CHF) ([place])   . Diabetes ([place])   . GERD (gastroesophageal reflux disease)   . Hyperlipidemia   . Hypertension   . 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 CARDIAC CATHETERIZATION N/A 12/03/2015   Procedure: Right/Left Heart Cath and Coronary Angiography;  Surgeon: Peter M  Martinique, MD;  Location: [place] [facility];  Service: Cardiovascular;  Laterality: N/A;  . PERIPHERAL VASCULAR CATHETERIZATION N/A 12/03/2015   Procedure: Thoracic Aortogram;  Surgeon: Peter M Martinique, MD;  Location: [place] [facility];  Service: Cardiovascular;  Laterality: N/A;    SOCIAL HISTORY: Social History   Socioeconomic History  . Marital status: Widowed    Spouse name: Not on file  . Number of children: Not on file  . Years of education: Not on file  . Highest education level: Not on file  Occupational History  . Not on file  Social Needs  . Financial resource strain: Not on file  . Food insecurity:    Worry: Not on file    Inability: Not on file  . Transportation needs:    Medical: Not on file    Non-medical: Not on file  Tobacco Use  . Smoking status: Former Smoker    Packs/day: 0.50    Years: 46.00    Pack years: 23.00    Types: Cigarettes    Start date: 10/27/1949    Last attempt to quit: 04/27/1995    Years since quitting: 22.3  . Smokeless tobacco: Never Used  Substance and Sexual Activity  . Alcohol use: Never    Alcohol/week: 0.0 oz  Frequency: Never  . Drug use: Never  . Sexual activity: Not on file  Lifestyle  . Physical activity:    Days per week: Not on file    Minutes per session: Not on file  . Stress: Not on file  Relationships  . Social connections:    Talks on phone: Not on file    Gets together: Not on file    Attends religious service: Not on file    Active member of club or organization: Not on file    Attends meetings of clubs or organizations: Not on file    Relationship status: Not on file  . Intimate partner violence:    Fear of current or ex partner: Not on file    Emotionally abused: Not on file    Physically abused: Not on file    Forced sexual activity: Not on file  Other Topics Concern  . Not on file  Social History Narrative  . Not on file    FAMILY HISTORY: Family History  Family history unknown: Yes  Patient denies  any family history of cancers   ALLERGIES:  has No Known Allergies.  MEDICATIONS:  Current Outpatient Medications  Medication Sig Dispense Refill  . allopurinol (ZYLOPRIM) 300 MG tablet Take 300 mg by mouth daily.     Thomas Costa docusate sodium (COLACE) 100 MG capsule Take 100-200 mg by mouth daily as needed for mild constipation.    Thomas Costa ELIQUIS 5 MG TABS tablet TAKE 1 TABLET BY MOUTH TWICE DAILY 60 tablet 3  . folic acid (FOLVITE) 1 MG tablet Take 1 mg by mouth daily.     Thomas Costa ibuprofen (ADVIL,MOTRIN) 200 MG tablet Take 200 mg by mouth every 6 (six) hours as needed.    . isosorbide mononitrate (IMDUR) 30 MG 24 hr tablet Take 15 mg by mouth every morning.    . magnesium oxide (MAG-OX) 400 MG tablet Take 400 mg by mouth 2 (two) times daily.    . metoprolol succinate (TOPROL-XL) 25 MG 24 hr tablet Take 25 mg by mouth 2 (two) times daily.    Thomas Costa omeprazole (PRILOSEC) 40 MG capsule Take 40 mg by mouth daily.     . potassium chloride SA (K-DUR,KLOR-CON) 20 MEQ tablet Take 40 mEq by mouth 2 (two) times daily.    . Sacubitril-Valsartan (ENTRESTO PO) Take 2.5 mg by mouth.    . furosemide (LASIX) 20 MG tablet Take 1 tablet (20 mg total) by mouth daily as needed. 90 tablet 1   No current facility-administered medications for this visit.     REVIEW OF SYSTEMS:    .10 Point review of Systems was done is negative except as noted above.  PHYSICAL EXAMINATION:  ECOG PERFORMANCE STATUS: 2 - Symptomatic, <50% confined to bed  . Vitals:   09/09/17 1050  BP: 136/73  Pulse: (!) 54  Resp: 19  SpO2: 95%   Filed Weights   09/09/17 1050  Weight: 167 lb (75.8 kg)   .Body mass index is 23.29 kg/m. Thomas Costa GENERAL:alert, in no acute distress and comfortable SKIN: no acute rashes, no significant lesions EYES: conjunctiva are pink and non-injected, sclera anicteric OROPHARYNX: MMM, no exudates, no oropharyngeal erythema or ulceration NECK: supple, no JVD LYMPH:  no palpable lymphadenopathy in the cervical, axillary  or inguinal regions LUNGS: clear to auscultation b/l with normal respiratory effort HEART: regular rate & rhythm ABDOMEN:  normoactive bowel sounds , non tender, not distended. Extremity: no pedal edema PSYCH: alert & oriented x 3 with fluent  speech NEURO: no focal motor/sensory deficits   LABORATORY DATA:  I have reviewed the data as listed  . CBC Latest Ref Rng & Units 08/29/2017 02/03/2017 02/03/2016  WBC 4.0 - 10.3 K/uL 2.6(L) 2.8(L) 2.7(L)  Hemoglobin 13.0 - 17.1 g/dL 12.7(L) 13.3 12.0(L)  Hematocrit 38.4 - 49.9 % 39.2 40.6 36.2(L)  Platelets 140 - 400 K/uL 136(L) 121(L) 99(L)   . CBC    Component Value Date/Time   WBC 2.6 (L) 08/29/2017 1108   WBC 2.8 (L) 02/03/2017 1610   RBC 4.16 (L) 08/29/2017 1108   RBC 4.16 (L) 08/29/2017 1108   HGB 12.7 (L) 08/29/2017 1108   HGB 12.0 (L) 02/03/2016 1404   HCT 39.2 08/29/2017 1108   HCT 36.2 (L) 02/03/2016 1404   PLT 136 (L) 08/29/2017 1108   PLT 99 (L) 02/03/2016 1404   MCV 94.2 08/29/2017 1108   MCV 85.2 02/03/2016 1404   MCH 30.5 08/29/2017 1108   MCHC 32.4 08/29/2017 1108   RDW 14.5 08/29/2017 1108   RDW 16.6 (H) 02/03/2016 1404   LYMPHSABS 0.8 (L) 08/29/2017 1108   LYMPHSABS 0.7 (L) 02/03/2016 1404   MONOABS 0.3 08/29/2017 1108   MONOABS 0.3 02/03/2016 1404   EOSABS 0.1 08/29/2017 1108   EOSABS 0.1 02/03/2016 1404   BASOSABS 0.0 08/29/2017 1108   BASOSABS 0.0 02/03/2016 1404   . CMP Latest Ref Rng & Units 08/29/2017 02/03/2017 02/03/2016  Glucose 70 - 140 mg/dL 88 100(H) 99  BUN 7 - 26 mg/dL 16 13 14.4  Creatinine 0.70 - 1.30 mg/dL 1.41(H) 1.32(H) 1.1  Sodium 136 - 145 mmol/L 135(L) 132(L) 140  Potassium 3.5 - 5.1 mmol/L 4.8 4.4 2.8(LL)  Chloride 98 - 109 mmol/L 106 103 -  CO2 22 - 29 mmol/L 25 22 24   Calcium 8.4 - 10.4 mg/dL 9.4 9.0 8.8  Total Protein 6.4 - 8.3 g/dL 7.4 - 6.9  Total Bilirubin 0.2 - 1.2 mg/dL 0.5 - 0.71  Alkaline Phos 40 - 150 U/L 79 - 84  AST 5 - 34 U/L 13 - 17  ALT 0 - 55 U/L <6 - 9    . Component     Latest Ref Rng & Units 02/03/2016 08/29/2017  Chromogranin A     0 - 5 nmol/L 9 (H)   Chromogranin A     0 - 5 nmol/L  10 (H)      RADIOGRAPHIC STUDIES: I have personally reviewed the radiological images as listed and agreed with the findings in the report. Restaging PET, 08/17/2017 IMPRESSION:  1. Interval enlargement of anterior mediastinal mass compared with prior PET-CT 11/24/2015. The degree of hypermetabolic activity associated with this mass is similar to the previous study. 2. Two new hypermetabolic right axillary lymph nodes, indeterminate in etiology. This would be an atypical pattern for isolated metastatic disease from the patient's thymoma. Low-level activity within mediastinal lymph nodes is similar to the previous study. 3. Persistent hypermetabolic polypoid lesion in the left maxillary sinus. This could reflect a polyp, indolent neoplasm or chronic inflammation. 4. Grossly stable incidental findings including hepatic and renal cysts and diffuse Aortic Atherosclerosis (ICD10-I70.0).   PET/CT 11/24/2015: IMPRESSION: 4.2 x 6.6 cm hypermetabolic anterior mediastinal mass. Primary differential considerations include lymphoma or thymoma.  10 mm short axis hypermetabolic right axillary node, raising the possibility of nodal metastasis. Additional mediastinal nodes do not demonstrate appreciable hypermetabolism.  2.2 x 1.9 cm hypermetabolic polypoid lesion along the medial wall of the left maxillary sinus, possibly reflecting a benign or  malignant maxillary sinus polyp. Metastatic disease to this location would be considered unusual. Consider maxillofacial MRI with/without contrast for further characterization as clinically warranted.   Electronically Signed   By: Julian Hy M.D.   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 Anthony Sar on 06/27/2015 at [place] [facility]. Noted to have one positive lymph node and extramural nodule as per outside report. Chromogranin level borderline elevated to 9 , nl serotonin. No overt evidence of recurrent neuro-endocrine tumor at this time. Restaging PET scan on 08/17/2017 showed: Interval enlargement of anterior mediastinal mass compared with prior PET-CT 11/24/2015. The degree of hypermetabolic activity associated with this mass is similar to the previous study. Two new hypermetabolic right axillary lymph nodes, indeterminate in etiology. This would be an atypical pattern for isolated metastatic disease from the patient's tymoma. Low-level activity within mediastinal lymph nodes is similar to the previous study. Persistent hypermetabolic polypoid lesion in the left maxillary sinus. This could reflect a polyp, indolent neoplasm or chronic inflammation. Grossly stable incidental findings including hepatic and renal cysts and diffuse Aortic Atherosclerosis (ICD10-I70.0).  2) anterior mediastinal mass -4.4 x 6.5 x 5.4 cm was noted with a few mildly prominent mediastinal lymph nodes. Noted to FDG avid on PET/CT scan. CT guided biopsy consistent with thymoma. This was incidentally noted on imaging has remained asymptomatic . No overt associated paraneoplastic presentation at this time.  PLAN -reviewed with patient labs from 08/29/2017 of CBC, CMP, Chromogranin A is as follows: all values are WNL except for reticulocytes with retic ct pct at 0.7, RBC at 4.16, retic count, absolute at 29.1. Chromogranin A at 10. (unchanged from previosu), CMP with Cr at 1.41. CBC with WBC at 2.6, Hgb at 12.7, PLT at 136K, and lymphs abs at 0.8.  -PET/CT from 08/17/2017 was discussed in details -- gradual currently asymptomatic progression of thymoma.age and significant medical co-morbids. -These labs were discussed with the patient and his sons today.  -I  discussed the general treatment course with the patient and his sons today.  -I discussed that while the tumor isn't causes issues at this time, that the treatment can cause issues due to his co-morbidities.  -I also discussed that if the tumor started to cause symptoms, then I will arrange for him to have palliative radiation treatments or chemotherapy, if this is what the patient wishes.  -Patient notes that he would like to opt for surveillance at this time and is not inclined to consider surgeyr/ctx or RT for his thymoma at this time.. Goals of care discussed in details. -no overt evidence of recurrent neuro-endocrine tumor.   3) thrombocytopenia mild PLT 136k. No evidence of bleeding - monitor -PLT at 136K as of 08/29/2017  Plan -Discussed patient prior labs and PET scan -Will continue with surveillance of his tumor with routine CT scans   -RTC with Dr Irene Limbo in 4 months with labs   All of the patients questions were answered with apparent satisfaction. The patient knows to call the clinic with any problems, questions or concerns.  . The total time spent in the appointment was 25 minutes and more than 50% was on counseling and direct patient cares.     Sullivan Lone MD [place] AAHIVMS [facility] Hematology/Oncology Physician [facility]  (Office):       [phone] (Work cell):  [phone] (  Fax):           [phone]  This document serves as a record of services personally performed by Sullivan Lone, MD. It was created on his behalf by Steva Colder, a trained medical scribe. The creation of this record is based on the scribe's personal observations and the provider's statements to them.   .I have reviewed the above documentation for accuracy and completeness, and I agree with the above. Brunetta Genera MD MS

## 2017-09-08 NOTE — Telephone Encounter (Signed)
Pt called triage yesterday requesting call back from Dr. Grier Mitts nurse. Called pt and left VM requesting for call back to [phone] and to leave questions or concerns, so that office staff is able to call back with available information to share with the patient.

## 2017-09-09 ENCOUNTER — Inpatient Hospital Stay (HOSPITAL_BASED_OUTPATIENT_CLINIC_OR_DEPARTMENT_OTHER): Payer: Medicare FFS | Admitting: Hematology

## 2017-09-09 ENCOUNTER — Telehealth: Payer: Self-pay

## 2017-09-09 ENCOUNTER — Encounter: Payer: Self-pay | Admitting: Hematology

## 2017-09-09 VITALS — BP 136/73 | HR 54 | Resp 19 | Ht 71.0 in | Wt 167.0 lb

## 2017-09-09 DIAGNOSIS — C7A019 Malignant carcinoid tumor of the small intestine, unspecified portion: Secondary | ICD-10-CM

## 2017-09-09 DIAGNOSIS — Z79899 Other long term (current) drug therapy: Secondary | ICD-10-CM | POA: Diagnosis not present

## 2017-09-09 DIAGNOSIS — I700 Atherosclerosis of aorta: Secondary | ICD-10-CM | POA: Diagnosis not present

## 2017-09-09 DIAGNOSIS — D150 Benign neoplasm of thymus: Secondary | ICD-10-CM | POA: Diagnosis not present

## 2017-09-09 DIAGNOSIS — D696 Thrombocytopenia, unspecified: Secondary | ICD-10-CM | POA: Diagnosis not present

## 2017-09-09 DIAGNOSIS — D4989 Neoplasm of unspecified behavior of other specified sites: Secondary | ICD-10-CM

## 2017-09-09 DIAGNOSIS — C7A00 Malignant carcinoid tumor of unspecified site: Secondary | ICD-10-CM

## 2017-09-09 DIAGNOSIS — C7B09 Secondary carcinoid tumors of other sites: Secondary | ICD-10-CM

## 2017-09-09 NOTE — Telephone Encounter (Signed)
Spoke with patient son concerning upcoming appointment. Mailed a Teacher, adult education. Per 5/17 los

## 2017-09-20 ENCOUNTER — Other Ambulatory Visit: Payer: Self-pay

## 2017-09-20 ENCOUNTER — Ambulatory Visit (INDEPENDENT_AMBULATORY_CARE_PROVIDER_SITE_OTHER): Payer: Medicare FFS | Admitting: Cardiology

## 2017-09-20 ENCOUNTER — Encounter: Payer: Self-pay | Admitting: Cardiology

## 2017-09-20 VITALS — BP 124/69 | HR 61 | Ht 71.0 in | Wt 167.0 lb

## 2017-09-20 DIAGNOSIS — I340 Nonrheumatic mitral (valve) insufficiency: Secondary | ICD-10-CM | POA: Diagnosis not present

## 2017-09-20 DIAGNOSIS — I5022 Chronic systolic (congestive) heart failure: Secondary | ICD-10-CM | POA: Diagnosis not present

## 2017-09-20 DIAGNOSIS — I480 Paroxysmal atrial fibrillation: Secondary | ICD-10-CM | POA: Diagnosis not present

## 2017-09-20 NOTE — Progress Notes (Signed)
Clinical Summary Thomas Costa is a 84 y.o.male seen today for follow up of the following medical problems.   1. Afib - long history of afib according to notes - recent afib with RVR during 09/2015 admission at [facility] - previous hematemsis when on anticoagulant in the remote past.  - mention of sick sinus syndrome in the past, the specificis are not well described. .    - denies any palpitations. No bleeding on eliquis - compliant with meds.   2. GI bleedinghistory - several old records reviewed to better understand his GI bleeding history including records from [place] and [place] - In 2010 admitted to [facility] with GI bleed. Received 2 units of pRBCs. EGD and colonoscopy unremarkable.  In 2012 patient was in [facility] with hematocheazia. He had EGD and Colonoscopy by Dr.Oneil and Capsule endoscopy by Dr.Fein which was negative. He had a bleeding scan which was negative. Similar things happened few years ago when he went to [facility] and w/up was negative.  He bleeding restarted and he is having hematocheazia again x 2  He was originally on coumadin for afib - Pt had no further GIB while in hospital and had stable Hgb. EGD/colo were negative. Capsule study done and results pending. Dr. Curly Sh[place] to call pt w/ results. Pt recommended to remain off coumadin given h/o GIB with no source found at this time. Ok to d/c home today. - admit 02/2012 with GI bleed while off anticoag, thought to be diverticular bleed. Received 2 units of pRBCs. Colonoscopy with large clots in cecum.   - has tolerated anticoagulation thus far without bleeding troubles.    3. History of TIA - acute episode of slurred speech, facial droop, confusion during recent admission at [facility] - now on eliquis.   - denies any new neuro symptoms since last visit.     4. Chronic systolic HF - admit 05/2977 with CHF to [facility], appears to be new diagnosis -09/2015 echo [facility] LVEF  20-25%, moderate AI, modertate to severe MR,  - clinic note from 2013 mentions LVEF 55-60%, moderate MR.  - EKG LBBB of unknown duration.  - cath 11/2015 with mild nonobstructive CAD, CI 2.2, mean PA 23, PCWP 11 - Jan 2018 echo [facility]: LVEF 25-30%, mild RV dysfunction, severe MR, mod TR, restrictive diastolic function    - no recent SOB. No recent LE edema - home weights around 167 lbs.   5. 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  - s/p CT guided biopsy  - followed by CT surgery and oncology   6Mitral regurgitation - long history of MR followed by previous cardiologist - by echo 09/2015 reported moderate to severe - echo Jan 2018 [facility] Hights severe MR.   - denies any recent symptoms   7. 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 Unclear if missed dilated portion.  - CT PE [facility] 09/2015 with mildy dilated ascedning aorta at 4.2 cm.      Past Medical History:  Diagnosis Date  . Atrial fibrillation, permanent ([place])   . Chronic anemia   . Chronic leukopenia   . Congestive heart failure (CHF) ([place])   . Diabetes ([place])   . GERD (gastroesophageal reflux disease)   . Hyperlipidemia   . Hypertension   . Pancytopenia ([facility])      No Known Allergies  Current Outpatient Medications  Medication Sig Dispense Refill  . allopurinol (ZYLOPRIM) 300 MG tablet Take 300 mg by mouth daily.     [name] docusate sodium (COLACE) 100 MG capsule Take 100-200 mg by mouth daily as needed for mild constipation.    [name] ELIQUIS 5 MG TABS tablet TAKE 1 TABLET BY MOUTH TWICE DAILY 60 tablet 3  . folic acid (FOLVITE) 1 MG tablet Take 1 mg by mouth daily.     . furosemide (LASIX) 20 MG tablet Take 1 tablet (20 mg total) by mouth daily as needed. 90 tablet 1  . ibuprofen (ADVIL,MOTRIN) 200 MG tablet Take 200 mg by mouth every 6 (six) hours as  needed.    . isosorbide mononitrate (IMDUR) 30 MG 24 hr tablet Take 15 mg by mouth every morning.    . magnesium oxide (MAG-OX) 400 MG tablet Take 400 mg by mouth 2 (two) times daily.    . metoprolol succinate (TOPROL-XL) 25 MG 24 hr tablet Take 25 mg by mouth 2 (two) times daily.    [name] omeprazole (PRILOSEC) 40 MG capsule Take 40 mg by mouth daily.     . potassium chloride SA (K-DUR,KLOR-CON) 20 MEQ tablet Take 40 mEq by mouth 2 (two) times daily.    . Sacubitril-Valsartan (ENTRESTO PO) Take 2.5 mg by mouth.     No current facility-administered medications for this visit.      Past Surgical History:  Procedure Laterality Date  . 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 CARDIAC CATHETERIZATION N/A 12/03/2015   Procedure: Right/Left Heart Cath and Coronary Angiography;  Surgeon: Peter M Martinique, MD;  Location: [facility];  Service: Cardiovascular;  Laterality: N/A;  . PERIPHERAL VASCULAR CATHETERIZATION N/A 12/03/2015   Procedure: Thoracic Aortogram;  Surgeon: Peter M Martinique, MD;  Location: [facility];  Service: Cardiovascular;  Laterality: N/A;     No Known Allergies    Family History  Family history unknown: Yes     Social History Thomas Costa reports that he quit smoking about 22 years ago. His smoking use included cigarettes. He started smoking about 67 years ago. He has a 23.00 pack-year smoking history. He has never used smokeless tobacco. Thomas Costa reports that he does not drink alcohol.   Review of Systems CONSTITUTIONAL: No weight loss, fever, chills, weakness or fatigue.  HEENT: Eyes: No visual loss, blurred vision, double vision or yellow sclerae.No hearing loss, sneezing, congestion, runny nose or sore throat.  SKIN: No rash or itching.  CARDIOVASCULAR: per hpi RESPIRATORY: No shortness of breath, cough or sputum.  GASTROINTESTINAL: No anorexia, nausea, vomiting or diarrhea. No  abdominal pain or blood.  GENITOURINARY: No burning on urination, no polyuria NEUROLOGICAL: No headache, dizziness, syncope, paralysis, ataxia, numbness or tingling in the extremities. No change in bowel or bladder control.  MUSCULOSKELETAL: No muscle, back pain, joint pain or stiffness.  LYMPHATICS: No enlarged nodes. No history of splenectomy.  PSYCHIATRIC: No history of depression or anxiety.  ENDOCRINOLOGIC: No reports of sweating, cold or heat intolerance. No polyuria or polydipsia.  [name]   Physical Examination Vitals:   09/20/17 1009  BP: 124/69  Pulse: 61  SpO2: 99%   Vitals:   09/20/17 1009  Weight: 167 lb (75.8 kg)  Height: 5\' 11"  (1.803 m)    Gen: resting comfortably, no acute distress HEENT: no scleral icterus, pupils equal round and reactive, no palptable cervical adenopathy,  CV: irreg,  3/6 systolic murmur at apex, no jvd Resp: Clear to auscultation bilaterally GI: abdomen is soft, non-tender, non-distended, normal bowel sounds, no hepatosplenomegaly MSK: extremities are warm, no edema.  Skin: warm, no rash Neuro:  no focal deficits Psych: appropriate affect   Diagnostic Studies 11/2015 LHC/RHC  Prox LAD to Mid LAD lesion, 20 %stenosed.  There is severe left ventricular systolic dysfunction.  The left ventricular ejection fraction is less than 25% by visual estimate.  There is mild (2+) mitral regurgitation.  LV end diastolic pressure is normal.  LV end diastolic pressure is normal.  1. Minor nonobstructive CAD 2. Severe LV enlargement with markedly reduced LV systolic function. 3. Moderate dilation of proximal aorta. 4. Normal right heart pressures, normal LV filling pressures.  5. Mild MR Aortic Root: The aortic root displays moderate dilation. Plan: medical management.    Jan 2018 Echo [facility] LVEF 25-30%, severe MR.       Assessment and Plan  1. PAF - CHADS2Vasc score is 6, continue anticoagulation.  - no recent symptoms,  continue current meds  2. Chronic systolic HF - prior orthostatic symptoms limit medical therapy - he is a poor ICD candidate given he is NICM with advanced age, mediastinal mass  - appears euvolemic, no recent symptoms. Continue current meds. Entresto dosing is actually 24/26mg  bid.   3. Mitral regurgitation - no symptoms - repeat echo.    F/u 6 months    Thomas Costa, M.D.

## 2017-09-20 NOTE — Patient Instructions (Signed)
Your physician wants you to follow-up in: [place] will receive a reminder letter in the mail two months in advance. If you don't receive a letter, please call our office to schedule the follow-up appointment.  Your physician recommends that you continue on your current medications as directed. Please refer to the Current Medication list given to you today.  ENTRESTO DOSE IS 24/26 MG TWICE DAILY   Your physician has requested that you have an echocardiogram. Echocardiography is a painless test that uses sound waves to create images of your heart. It provides your doctor with information about the size and shape of your heart and how well your heart's chambers and valves are working. This procedure takes approximately one hour. There are no restrictions for this procedure.  Thank you for choosing [facility]!!

## 2017-10-06 ENCOUNTER — Ambulatory Visit (INDEPENDENT_AMBULATORY_CARE_PROVIDER_SITE_OTHER): Payer: Medicare FFS

## 2017-10-06 ENCOUNTER — Other Ambulatory Visit: Payer: Self-pay

## 2017-10-06 DIAGNOSIS — I340 Nonrheumatic mitral (valve) insufficiency: Secondary | ICD-10-CM

## 2017-10-13 ENCOUNTER — Telehealth: Payer: Self-pay

## 2017-10-13 NOTE — Telephone Encounter (Signed)
-----   Message from Arnoldo Lenis, MD sent at 10/10/2017  3:30 PM EDT ----- Echo shows heart function has improved since last check, no up to 40-45% (normal is 50-60%). He still has a heart valve that is very leaky, we will continue to monitor this   Zandra Abts MD

## 2017-10-13 NOTE — Telephone Encounter (Signed)
Son notified. Routed to PCP

## 2018-01-05 ENCOUNTER — Telehealth: Payer: Self-pay | Admitting: Hematology

## 2018-01-05 NOTE — Telephone Encounter (Signed)
Spoke to pt's son regarding r/s appts per 9/11 sch message

## 2018-01-06 ENCOUNTER — Inpatient Hospital Stay: Payer: Medicare FFS

## 2018-01-06 ENCOUNTER — Inpatient Hospital Stay: Payer: Medicare FFS | Admitting: Hematology

## 2018-01-17 ENCOUNTER — Telehealth: Payer: Self-pay

## 2018-01-17 NOTE — Telephone Encounter (Signed)
Received call from pt son requesting time of appt on Friday (01/20/18). Confirmed that lab work is scheduled for 0845 followed by appt with Dr. Irene Limbo at [phone]. Pt son verbalized understanding.

## 2018-01-19 NOTE — Progress Notes (Signed)
[name]    HEMATOLOGY/ONCOLOGY CLINIC NOTE  Date of Service: 01/20/2018  Patient Care Team: Lonia Mad, MD as PCP - General (Internal Medicine)  Surgeon Dr. Valentino Saxon  Cardiology : Dr.Branch  Son - Ruxin Ransome -[phone]    CHIEF COMPLAINTS/PURPOSE OF CONSULTATION:  Abdominal Carcinoid tumor Newly diagnosed thymoma  HISTORY OF PRESENTING ILLNESS:   Thomas Costa. is a wonderful 84 y.o. male who has been referred to [place] by Dr Tressie Stalker for continued care for his abdominal carcinoid .   patient presented in early March 2017 with abdominal pain, 30 pound weight loss over 6 months and increasing indigestion and abdominal pain. He also had nausea and vomiting. Patient was admitted to the hospital and had a CT of the abdomen that showed possible tumor in the mesentery. He was taken to surgery by Dr. Anthony Sar on 06/27/2015 and had resection of all visible tumor. He had partial small bowel resection a lymph node biopsy and he was found to have a tumor implant in the mesenteric tissue. The primary was 1.1 x 0.5-0.4 cm, he had one lymph node found to be involved and he had a 2.0 cm tumoral implant in the mesenteric tissue which was apparently resected. Patient had no symptoms of carcinoid syndrome.  He was seen in clinic on 07/21/2015 by Dr. Tressie Stalker at [facility] in [place], [place] and baseline labs were done at the time. CBC was noted to be normal with no anemia. CMP was within normal limits with no liver function abnormalities.  The actual pathology report and the baseline lab results were not available to [place].  Patient notes he healed well from his abdominal surgery. He has been taking in short and his oral appetite has improved and he feels that he is gaining back some weight. He notes that at its lowest his weight was 135 pounds and he is back up to 157.4 pounds now. Patient reports that his good baseline is 170 pounds.  Patient has significant systolic CHF and follows up with cardiology he  had a recent echocardiogram 10/20/2015 that showed left ventricular chamber size is moderately dilated, moderate concentric LVH with severely reduced left ventricular systolic function with an estimated ejection fraction of 20-25% . Also noted to have moderate to severe mitral regurgitation and moderate aortic regurgitation.  He presented to [facility] with acute dyspnea on 10/20/2015 and had a CTA of the chest with contrast that was negative for pulmonary embolism but an anterior mediastinal mass measuring 4.4 x 6.5 x 5.4 cm was noted with a few mildly prominent mediastinal lymph nodes measuring 1.5 cm and a mildly prominent right hilar lymph node. Considerations were lymphoma versus teratoma versus thymoma. Liver lesions were also apparently noted. No significant skeletal lesions noted. Bilateral pleural effusions right greater than left.  Prior to this the patient had a CT of the abdomen and pelvis with contrast on 10/18/2015 at [facility] for evaluation of generalized abdominal pain intermittently for the last 3 months. This showed postoperative changes in the abdomen with evidence of a partial small bowel resection and removal of the mediastinal mass. Small lymph nodes in the mesentery. Numerous low-density structures in the liver which probably represent cysts. Index lesion in the posterior left hepatic lobe measures 2.4 cm and stable. Noted to have gallbladder distention with some new pericholecystic fluid.  The CT scan result was provided to [place] after the patient's clinical visit. Images were requested and disc.   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 The patient's last visit with [place] was on 09/09/17. He is accompanied today by his sons. The pt reports that he is doing well overall.   The pt reports that he was decreased on his 5mg  Eiquis from BID to once a day with concerns for dizziness.   The pt notes that he has  felt a little tired however he continues eating well. The pt also notes that he has rare, intermittent abdominal pains but notes he continues to move his bowels well.   Lab results today (01/20/18) of CBC w/diff, CMP, and Reticulocytes is as follows: all values are WNL except for WBC at 2.7k, RBC at 3.94, HGB at 11.9, HCT at 37.1, RDW at 15.0, PLT at 129k, Lymphs abs at 600, Creatinine at 1.26, Calcium at 8.7, AST at 12.  On review of systems, pt reports some fatigue, moving his bowels well, eating well, intermittent abdominal pains, and denies blood in the stools, black stools, nausea, vomiting, diarrhea, CP, SOB, skin rashes, problems with hand/face swelling, difficulty breathing, leg swelling, and any other symptoms.   MEDICAL HISTORY:  Past Medical History:  Diagnosis Date  . Atrial fibrillation, permanent ([place])   . Chronic anemia   . Chronic leukopenia   . Congestive heart failure (CHF) ([place])   . Diabetes ([place])   . GERD (gastroesophageal reflux disease)   . Hyperlipidemia   . Hypertension   . 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 CARDIAC CATHETERIZATION N/A 12/03/2015   Procedure: Right/Left Heart Cath and Coronary Angiography;  Surgeon: Peter M Martinique, MD;  Location: [place] [facility];  Service: Cardiovascular;  Laterality: N/A;  . PERIPHERAL VASCULAR CATHETERIZATION N/A 12/03/2015   Procedure: Thoracic Aortogram;  Surgeon: Peter M Martinique, MD;  Location: [place] [facility];  Service: Cardiovascular;  Laterality: N/A;    SOCIAL HISTORY: Social History   Socioeconomic History  . Marital status: Widowed    Spouse name: Not on file  . Number of children: Not on file  . Years of education: Not on file  . Highest education level: Not on file    Occupational History  . Not on file  Social Needs  . Financial resource strain: Not on file  . Food insecurity:    Worry: Not on file    Inability: Not on file  . Transportation needs:    Medical: Not on file    Non-medical: Not on file  Tobacco Use  . Smoking status: Former Smoker    Packs/day: 0.50    Years: 46.00    Pack years: 23.00    Types: Cigarettes    Start date: 10/27/1949    Last attempt to quit: 04/27/1995    Years since quitting: 22.7  . Smokeless tobacco: Never Used  Substance and Sexual Activity  . Alcohol use: Never    Alcohol/week: 0.0 standard drinks    Frequency: Never  . Drug use: Never  . Sexual activity: Not on file  Lifestyle  . Physical activity:    Days per week: Not on file    Minutes per session: Not on file  . Stress: Not on file  Relationships  . Social connections:    Talks on phone: Not on file  Gets together: Not on file    Attends religious service: Not on file    Active member of club or organization: Not on file    Attends meetings of clubs or organizations: Not on file    Relationship status: Not on file  . Intimate partner violence:    Fear of current or ex partner: Not on file    Emotionally abused: Not on file    Physically abused: Not on file    Forced sexual activity: Not on file  Other Topics Concern  . Not on file  Social History Narrative  . Not on file    FAMILY HISTORY: Family History  Family history unknown: Yes  Patient denies any family history of cancers   ALLERGIES:  has No Known Allergies.  MEDICATIONS:  Current Outpatient Medications  Medication Sig Dispense Refill  . allopurinol (ZYLOPRIM) 300 MG tablet Take 300 mg by mouth daily.     [name] apixaban (ELIQUIS) 5 MG TABS tablet Take 5 mg by mouth daily.    [name] docusate sodium (COLACE) 100 MG capsule Take 100-200 mg by mouth daily as needed for mild constipation.    . folic acid (FOLVITE) 1 MG tablet Take 1 mg by mouth daily.     . furosemide (LASIX) 20 MG  tablet Take 20 mg by mouth as needed.    [name] ibuprofen (ADVIL,MOTRIN) 200 MG tablet Take 200 mg by mouth every 6 (six) hours as needed.    . isosorbide mononitrate (IMDUR) 30 MG 24 hr tablet Take 15 mg by mouth every morning.    . magnesium oxide (MAG-OX) 400 MG tablet Take 400 mg by mouth 2 (two) times daily.    . metoprolol succinate (TOPROL-XL) 25 MG 24 hr tablet Take 25 mg by mouth 2 (two) times daily.    [name] omeprazole (PRILOSEC) 40 MG capsule Take 40 mg by mouth daily.     . potassium chloride SA (K-DUR,KLOR-CON) 20 MEQ tablet Take 40 mEq by mouth 2 (two) times daily.    . sacubitril-valsartan (ENTRESTO) 24-26 MG Take 1 tablet by mouth 2 (two) times daily.     No current facility-administered medications for this visit.     REVIEW OF SYSTEMS:    A 10+ POINT REVIEW OF SYSTEMS WAS OBTAINED including neurology, dermatology, psychiatry, cardiac, respiratory, lymph, extremities, GI, GU, Musculoskeletal, constitutional, breasts, reproductive, HEENT.  All pertinent positives are noted in the HPI.  All others are negative.   PHYSICAL EXAMINATION:  ECOG PERFORMANCE STATUS: 2 - Symptomatic, <50% confined to bed  . Vitals:   01/20/18 1002  BP: 120/80  Pulse: 66  Resp: 18  Temp: 97.7 F (36.5 C)  SpO2: 100%   Filed Weights   01/20/18 1002  Weight: 163 lb 6.4 oz (74.1 kg)   .Body mass index is 22.79 kg/m.  GENERAL:alert, in no acute distress and comfortable SKIN: no acute rashes, no significant lesions EYES: conjunctiva are pink and non-injected, sclera anicteric OROPHARYNX: MMM, no exudates, no oropharyngeal erythema or ulceration NECK: supple, no JVD LYMPH:  no palpable lymphadenopathy in the cervical, axillary or inguinal regions LUNGS: clear to auscultation b/l with normal respiratory effort HEART: regular rate & rhythm ABDOMEN:  normoactive bowel sounds , non tender, not distended. No palpable hepatosplenomegaly.  Extremity: no pedal edema PSYCH: alert & oriented x 3 with  fluent speech NEURO: no focal motor/sensory deficits   LABORATORY DATA:  I have reviewed the data as listed  . CBC Latest Ref Rng & Units 01/20/2018  08/29/2017 02/03/2017  WBC 4.0 - 10.3 K/uL 2.7(L) 2.6(L) 2.8(L)  Hemoglobin 13.0 - 17.1 g/dL 11.9(L) 12.7(L) 13.3  Hematocrit 38.4 - 49.9 % 37.1(L) 39.2 40.6  Platelets 140 - 400 K/uL 129(L) 136(L) 121(L)   . CBC    Component Value Date/Time   WBC 2.7 (L) 01/20/2018 0836   RBC 3.94 (L) 01/20/2018 0836   RBC 3.94 (L) 01/20/2018 0836   HGB 11.9 (L) 01/20/2018 0836   HGB 12.7 (L) 08/29/2017 1108   HGB 12.0 (L) 02/03/2016 1404   HCT 37.1 (L) 01/20/2018 0836   HCT 36.2 (L) 02/03/2016 1404   PLT 129 (L) 01/20/2018 0836   PLT 136 (L) 08/29/2017 1108   PLT 99 (L) 02/03/2016 1404   MCV 94.2 01/20/2018 0836   MCV 85.2 02/03/2016 1404   MCH 30.2 01/20/2018 0836   MCHC 32.1 01/20/2018 0836   RDW 15.0 (H) 01/20/2018 0836   RDW 16.6 (H) 02/03/2016 1404   LYMPHSABS 0.6 (L) 01/20/2018 0836   LYMPHSABS 0.7 (L) 02/03/2016 1404   MONOABS 0.3 01/20/2018 0836   MONOABS 0.3 02/03/2016 1404   EOSABS 0.1 01/20/2018 0836   EOSABS 0.1 02/03/2016 1404   BASOSABS 0.0 01/20/2018 0836   BASOSABS 0.0 02/03/2016 1404   . CMP Latest Ref Rng & Units 01/20/2018 08/29/2017 02/03/2017  Glucose 70 - 99 mg/dL 94 88 100(H)  BUN 8 - 23 mg/dL 12 16 13   Creatinine 0.61 - 1.24 mg/dL 1.26(H) 1.41(H) 1.32(H)  Sodium 135 - 145 mmol/L 139 135(L) 132(L)  Potassium 3.5 - 5.1 mmol/L 4.2 4.8 4.4  Chloride 98 - 111 mmol/L 109 106 103  CO2 22 - 32 mmol/L 22 25 22   Calcium 8.9 - 10.3 mg/dL 8.7(L) 9.4 9.0  Total Protein 6.5 - 8.1 g/dL 7.0 7.4 -  Total Bilirubin 0.3 - 1.2 mg/dL 0.5 0.5 -  Alkaline Phos 38 - 126 U/L 74 79 -  AST 15 - 41 U/L 12(L) 13 -  ALT 0 - 44 U/L 6 <6 -   . Component     Latest Ref Rng & Units 02/03/2016 08/29/2017  Chromogranin A     0 - 5 nmol/L 9 (H)   Chromogranin A     0 - 5 nmol/L  10 (H)      RADIOGRAPHIC STUDIES: I have personally  reviewed the radiological images as listed and agreed with the findings in the report. Restaging PET, 08/17/2017 IMPRESSION:  1. Interval enlargement of anterior mediastinal mass compared with prior PET-CT 11/24/2015. The degree of hypermetabolic activity associated with this mass is similar to the previous study. 2. Two new hypermetabolic right axillary lymph nodes, indeterminate in etiology. This would be an atypical pattern for isolated metastatic disease from the patient's thymoma. Low-level activity within mediastinal lymph nodes is similar to the previous study. 3. Persistent hypermetabolic polypoid lesion in the left maxillary sinus. This could reflect a polyp, indolent neoplasm or chronic inflammation. 4. Grossly stable incidental findings including hepatic and renal cysts and diffuse Aortic Atherosclerosis (ICD10-I70.0).   PET/CT 11/24/2015: IMPRESSION: 4.2 x 6.6 cm hypermetabolic anterior mediastinal mass. Primary differential considerations include lymphoma or thymoma.  10 mm short axis hypermetabolic right axillary node, raising the possibility of nodal metastasis. Additional mediastinal nodes do not demonstrate appreciable hypermetabolism.  2.2 x 1.9 cm hypermetabolic polypoid lesion along the medial wall of the left maxillary sinus, possibly reflecting a benign or malignant maxillary sinus polyp. Metastatic disease to this location would be considered unusual. Consider maxillofacial MRI with/without contrast  for further characterization as clinically warranted.   Electronically Signed   By: Julian Hy M.D.   On: 11/24/2015 17:24   ASSESSMENT & PLAN:   84 y.o. African-American male with multiple medical comorbidities and severe systolic CHF with  1) ?Stage III small bowel carcinoid status post resection by Dr. Anthony Sar on 06/27/2015 at [place] [facility]. Noted to have one positive lymph node and extramural nodule as per outside report. Chromogranin level  borderline elevated to 9 , nl serotonin. No overt evidence of recurrent neuro-endocrine tumor at this time. Restaging PET scan on 08/17/2017 showed: Interval enlargement of anterior mediastinal mass compared with prior PET-CT 11/24/2015. The degree of hypermetabolic activity associated with this mass is similar to the previous study. Two new hypermetabolic right axillary lymph nodes, indeterminate in etiology. This would be an atypical pattern for isolated metastatic disease from the patient's tymoma. Low-level activity within mediastinal lymph nodes is similar to the previous study. Persistent hypermetabolic polypoid lesion in the left maxillary sinus. This could reflect a polyp, indolent neoplasm or chronic inflammation. Grossly stable incidental findings including hepatic and renal cysts and diffuse Aortic Atherosclerosis (ICD10-I70.0).  2) anterior mediastinal mass -4.4 x 6.5 x 5.4 cm was noted with a few mildly prominent mediastinal lymph nodes. Noted to FDG avid on PET/CT scan. CT guided biopsy consistent with thymoma. This was incidentally noted on imaging has remained asymptomatic . No overt associated paraneoplastic presentation at this time.  PLAN -no overt evidence of recurrent neuro-endocrine tumor or clinical progression of his thymoma. -Discussed pt labwork today, 01/20/18; blood counts and chemistries are stable  -Advised that the pt and his family reconnect urgently with his cardiologist regarding his Eliquis dosing as it has been reduced from BID to once a day, given his Afib  -The pt shows no clinical or lab progression of his thymoma at this time.  -No indication for initiating active treatment at this time.   -Pt's preference in his goals of care continue to be less aggressive approaches, and would only like repeat images if his symptoms indicate the necessity  -Will see the pt back in 4 months     3) thrombocytopenia mild PLT 129k. No evidence of bleeding - monitor  Plan -Will  continue with surveillance of his tumor with routine CT scans   RTC with Dr Irene Limbo with labs in 4 months    All of the patients questions were answered with apparent satisfaction. The patient knows to call the clinic with any problems, questions or concerns.  The total time spent in the appt was 20 minutes and more than 50% was on counseling and direct patient cares.     Sullivan Lone MD MS AAHIVMS [facility] Hematology/Oncology Physician [facility]  (Office):       [phone] (Work cell):  [phone] (Fax):           [phone]  I, Baldwin Jamaica, am acting as a scribe for Dr. Irene Limbo  .I have reviewed the above documentation for accuracy and completeness, and I agree with the above.

## 2018-01-20 ENCOUNTER — Encounter: Payer: Self-pay | Admitting: Hematology

## 2018-01-20 ENCOUNTER — Inpatient Hospital Stay: Payer: Medicare FFS | Attending: Hematology

## 2018-01-20 ENCOUNTER — Telehealth: Payer: Self-pay

## 2018-01-20 ENCOUNTER — Inpatient Hospital Stay (HOSPITAL_BASED_OUTPATIENT_CLINIC_OR_DEPARTMENT_OTHER): Payer: Medicare FFS | Admitting: Hematology

## 2018-01-20 ENCOUNTER — Telehealth: Payer: Self-pay | Admitting: *Deleted

## 2018-01-20 VITALS — BP 120/80 | HR 66 | Temp 97.7°F | Resp 18 | Ht 71.0 in | Wt 163.4 lb

## 2018-01-20 DIAGNOSIS — C7A00 Malignant carcinoid tumor of unspecified site: Secondary | ICD-10-CM

## 2018-01-20 DIAGNOSIS — Z87891 Personal history of nicotine dependence: Secondary | ICD-10-CM | POA: Insufficient documentation

## 2018-01-20 DIAGNOSIS — C7A019 Malignant carcinoid tumor of the small intestine, unspecified portion: Secondary | ICD-10-CM | POA: Insufficient documentation

## 2018-01-20 DIAGNOSIS — D4989 Neoplasm of unspecified behavior of other specified sites: Secondary | ICD-10-CM

## 2018-01-20 DIAGNOSIS — C7B09 Secondary carcinoid tumors of other sites: Secondary | ICD-10-CM

## 2018-01-20 DIAGNOSIS — D150 Benign neoplasm of thymus: Secondary | ICD-10-CM | POA: Insufficient documentation

## 2018-01-20 LAB — CBC WITH DIFFERENTIAL/PLATELET
BASOS ABS: 0 10*3/uL (ref 0.0–0.1)
BASOS PCT: 0 %
Eosinophils Absolute: 0.1 10*3/uL (ref 0.0–0.5)
Eosinophils Relative: 3 %
HEMATOCRIT: 37.1 % — AB (ref 38.4–49.9)
Hemoglobin: 11.9 g/dL — ABNORMAL LOW (ref 13.0–17.1)
Lymphocytes Relative: 22 %
Lymphs Abs: 0.6 10*3/uL — ABNORMAL LOW (ref 0.9–3.3)
MCH: 30.2 pg (ref 27.2–33.4)
MCHC: 32.1 g/dL (ref 32.0–36.0)
MCV: 94.2 fL (ref 79.3–98.0)
MONO ABS: 0.3 10*3/uL (ref 0.1–0.9)
Monocytes Relative: 11 %
NEUTROS ABS: 1.7 10*3/uL (ref 1.5–6.5)
NEUTROS PCT: 64 %
Platelets: 129 10*3/uL — ABNORMAL LOW (ref 140–400)
RBC: 3.94 MIL/uL — ABNORMAL LOW (ref 4.20–5.82)
RDW: 15 % — AB (ref 11.0–14.6)
WBC: 2.7 10*3/uL — ABNORMAL LOW (ref 4.0–10.3)

## 2018-01-20 LAB — CMP (CANCER CENTER ONLY)
ALT: 6 U/L (ref 0–44)
ANION GAP: 8 (ref 5–15)
AST: 12 U/L — ABNORMAL LOW (ref 15–41)
Albumin: 3.7 g/dL (ref 3.5–5.0)
Alkaline Phosphatase: 74 U/L (ref 38–126)
BUN: 12 mg/dL (ref 8–23)
CALCIUM: 8.7 mg/dL — AB (ref 8.9–10.3)
CHLORIDE: 109 mmol/L (ref 98–111)
CO2: 22 mmol/L (ref 22–32)
Creatinine: 1.26 mg/dL — ABNORMAL HIGH (ref 0.61–1.24)
GFR, EST AFRICAN AMERICAN: 59 mL/min — AB (ref >60)
GFR, Estimated: 51 mL/min — ABNORMAL LOW (ref >60)
Glucose, Bld: 94 mg/dL (ref 70–99)
Potassium: 4.2 mmol/L (ref 3.5–5.1)
SODIUM: 139 mmol/L (ref 135–145)
Total Bilirubin: 0.5 mg/dL (ref 0.3–1.2)
Total Protein: 7 g/dL (ref 6.5–8.1)

## 2018-01-20 LAB — RETICULOCYTES
RBC.: 3.94 MIL/uL — AB (ref 4.20–5.82)
RETIC COUNT ABSOLUTE: 35.5 10*3/uL (ref 34.8–93.9)
Retic Ct Pct: 0.9 % (ref 0.8–1.8)

## 2018-01-20 NOTE — Telephone Encounter (Signed)
Spoke with patient son concerning the upcoming appointment that is scheduled for him. Per 9/27 los will also mail a letter with a calender enclosed

## 2018-01-20 NOTE — Telephone Encounter (Signed)
Son called to report that patient has been taking eliquis 5 mg daily for one week. Per son, patient was told to reduce the eliquis to daily by a "nurse" (patient is unsure which nurse) for symptoms of dizziness and lightheadedness. Son advised that patient should be taking eliquis twice daily to reduce the risk of having a stroke. Son advised to check patients BP and HR the next time he is dizzy. Patient's BP today at oncology visit was 120/80 & HR 66. Son advised to contact our office if he develops these symptoms again. Patient is scheduled to see Dr. Harl Bowie on 03/07/18. Son verbalized understanding of plan.

## 2018-01-20 NOTE — Telephone Encounter (Signed)
Agree with your assessment, needs to be on eliquis 5mg  bid. Eliquis would not be causing any dizziness.   J Yessika Otte MD

## 2018-02-13 ENCOUNTER — Other Ambulatory Visit: Payer: Self-pay | Admitting: Cardiology

## 2018-03-07 ENCOUNTER — Ambulatory Visit: Payer: Medicare FFS | Admitting: Cardiology

## 2018-03-07 ENCOUNTER — Encounter: Payer: Self-pay | Admitting: Cardiology

## 2018-03-07 VITALS — BP 125/77 | HR 60 | Ht 71.0 in | Wt 167.0 lb

## 2018-03-07 DIAGNOSIS — I5022 Chronic systolic (congestive) heart failure: Secondary | ICD-10-CM | POA: Diagnosis not present

## 2018-03-07 DIAGNOSIS — I4891 Unspecified atrial fibrillation: Secondary | ICD-10-CM

## 2018-03-07 DIAGNOSIS — I712 Thoracic aortic aneurysm, without rupture, unspecified: Secondary | ICD-10-CM

## 2018-03-07 DIAGNOSIS — I340 Nonrheumatic mitral (valve) insufficiency: Secondary | ICD-10-CM | POA: Diagnosis not present

## 2018-03-07 DIAGNOSIS — I10 Essential (primary) hypertension: Secondary | ICD-10-CM

## 2018-03-07 NOTE — Patient Instructions (Signed)

## 2018-03-07 NOTE — Progress Notes (Signed)
Clinical Summary Thomas Costa is a 84 y.o.male seen today for follow up of the following medical problems.   1. Afib - long history of afib according to notes - recent afib with RVR during 09/2015 admission at [facility] - previous hematemsis when on anticoagulant in the remote past.  - mention of sick sinus syndrome in the past, the specificis are not well described. .    - no recent palpitations - compliant with meds - no bleeding on eliquis.     2. GI bleedinghistory - several old records reviewed to better understand his GI bleeding history including records from [place] and [place] - In 2010 admitted to [facility] with GI bleed. Received 2 units of pRBCs. EGD and colonoscopy unremarkable.  In 2012 patient was in [facility] with hematocheazia. He had EGD and Colonoscopy by Dr.Oneil and Capsule endoscopy by Dr.Fein which was negative. He had a bleeding scan which was negative. Similar things happened few years ago when he went to [facility] and w/up was negative.  He bleeding restarted and he is having hematocheazia again x 2  He was originally on coumadin for afib - Pt had no further GIB while in hospital and had stable Hgb. EGD/colo were negative. Capsule study done and results pending. Dr. Curly Shores to call pt w/ results. Pt recommended to remain off coumadin given h/o GIB with no source found at this time. Ok to d/c home today. - admit 02/2012 with GI bleed while off anticoag, thought to be diverticular bleed. Received 2 units of pRBCs. Colonoscopy with large clots in cecum.   - has tolerated anticoagulation thus far without bleeding troubles.   3. History of TIA - acute episode of slurred speech, facial droop, confusion during recent admission at [facility] - now on eliquis.     4. Chronic systolic HF - admit 0/3500 with CHF to [facility], appears to be new diagnosis -09/2015 echo [facility] LVEF 20-25%, moderate AI, modertate to severe MR,  -  clinic note from 2013 mentions LVEF 55-60%, moderate MR.  - EKG LBBB of unknown duration.  - cath 11/2015 with mild nonobstructive CAD, CI 2.2, mean PA 23, PCWP 11 - Jan 2018 echo [place]: LVEF 25-30%, mild RV dysfunction, severe MR, mod TR, restrictive diastolic function  12/3816 echo LVEF 40-45% - no recent SOB/DOE. No recent edema. Home weights 162 lbs and stable. Limiting sodium intake.   5. 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   - 09/2017 echo LVEF 40-45%, severe MR. Prolapse of anterior leaflet with severe posterior MR - denies any symptoms.    7. 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 Unclear if missed dilated portion.  - CT PE [facility] 09/2015 with mildy dilated ascedning aorta at 4.2 cm.   - 09/2017 echo aortic root 4.7 cm  Past Medical History:  Diagnosis Date  . Atrial fibrillation, permanent ([place])   . Chronic anemia   . Chronic leukopenia   . Congestive heart failure (CHF) ([place])   . Diabetes ([place])   . GERD (gastroesophageal reflux disease)   . Hyperlipidemia   . Hypertension   . Pancytopenia ([place])      No Known Allergies   Current Outpatient Medications  Medication Sig  Dispense Refill  . allopurinol (ZYLOPRIM) 300 MG tablet Take 300 mg by mouth daily.     [name] apixaban (ELIQUIS) 5 MG TABS tablet Take 5 mg by mouth 2 (two) times daily.    [name] docusate sodium (COLACE) 100 MG capsule Take 100-200 mg by mouth daily as needed for mild constipation.    [name] ENTRESTO 24-26 MG TAKE 1 TABLET BY MOUTH TWICE DAILY 952 tablet 1  . folic acid (FOLVITE) 1 MG tablet Take 1 mg by mouth daily.     . furosemide (LASIX) 20 MG tablet Take 20 mg by mouth as needed.    [name] ibuprofen (ADVIL,MOTRIN) 200 MG tablet Take  200 mg by mouth every 6 (six) hours as needed.    . isosorbide mononitrate (IMDUR) 30 MG 24 hr tablet Take 15 mg by mouth every morning.    . magnesium oxide (MAG-OX) 400 MG tablet Take 400 mg by mouth 2 (two) times daily.    . metoprolol succinate (TOPROL-XL) 25 MG 24 hr tablet Take 25 mg by mouth 2 (two) times daily.    [name] omeprazole (PRILOSEC) 40 MG capsule Take 40 mg by mouth daily.     . potassium chloride SA (K-DUR,KLOR-CON) 20 MEQ tablet Take 40 mEq by mouth 2 (two) times daily.    . sacubitril-valsartan (ENTRESTO) 24-26 MG Take 1 tablet by mouth 2 (two) times daily.     No current facility-administered medications for this visit.      Past Surgical History:  Procedure Laterality Date  . 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 CARDIAC CATHETERIZATION N/A 12/03/2015   Procedure: Right/Left Heart Cath and Coronary Angiography;  Surgeon: Peter M Martinique, MD;  Location: [place] [facility];  Service: Cardiovascular;  Laterality: N/A;  . PERIPHERAL VASCULAR CATHETERIZATION N/A 12/03/2015   Procedure: Thoracic Aortogram;  Surgeon: Peter M Martinique, MD;  Location: [place] [facility];  Service: Cardiovascular;  Laterality: N/A;     No Known Allergies    Family History  Family history unknown: Yes     Social History Thomas Costa reports that he quit smoking about 22 years ago. His smoking use included cigarettes. He started smoking about 68 years ago. He has a 23.00 pack-year smoking history. He has never used smokeless tobacco. Thomas Costa reports that he does not drink alcohol.   Review of Systems CONSTITUTIONAL: No weight loss, fever, chills, weakness or fatigue.  HEENT: Eyes: No visual loss, blurred vision, double vision or yellow sclerae.No hearing loss, sneezing, congestion, runny nose or sore throat.  SKIN: No rash or itching.  CARDIOVASCULAR: per hpi RESPIRATORY: No shortness of breath, cough or sputum.    GASTROINTESTINAL: No anorexia, nausea, vomiting or diarrhea. No abdominal pain or blood.  GENITOURINARY: No burning on urination, no polyuria NEUROLOGICAL: No headache, dizziness, syncope, paralysis, ataxia, numbness or tingling in the extremities. No change in bowel or bladder control.  MUSCULOSKELETAL: No muscle, back pain, joint pain or stiffness.  LYMPHATICS: No enlarged nodes. No history of splenectomy.  PSYCHIATRIC: No history of depression or anxiety.  ENDOCRINOLOGIC: No reports of sweating, cold or heat intolerance. No polyuria or polydipsia.  [name]   Physical Examination Vitals:   03/07/18 1026  BP: 125/77  Pulse: 60  SpO2: 98%   Vitals:   03/07/18 1026  Weight: 167 lb (75.8 kg)  Height: 5\' 11"  (1.803 m)    Gen: resting comfortably, no acute distress HEENT: no scleral icterus,  pupils equal round and reactive, no palptable cervical adenopathy,  CV: irreg, 2/6 systolic murmur apex, no jvd Resp: Clear to auscultation bilaterally GI: abdomen is soft, non-tender, non-distended, normal bowel sounds, no hepatosplenomegaly MSK: extremities are warm, no edema.  Skin: warm, no rash Neuro:  no focal deficits Psych: appropriate affect   Diagnostic Studies 11/2015 LHC/RHC  Prox LAD to Mid LAD lesion, 20 %stenosed.  There is severe left ventricular systolic dysfunction.  The left ventricular ejection fraction is less than 25% by visual estimate.  There is mild (2+) mitral regurgitation.  LV end diastolic pressure is normal.  LV end diastolic pressure is normal.  1. Minor nonobstructive CAD 2. Severe LV enlargement with markedly reduced LV systolic function. 3. Moderate dilation of proximal aorta. 4. Normal right heart pressures, normal LV filling pressures.  5. Mild MR Aortic Root: The aortic root displays moderate dilation. Plan: medical management.    Jan 2018 Echo [facility] LVEF 25-30%, severe MR.    09/2017 echo Study Conclusions  - Left  ventricle: The cavity size was normal. Wall thickness was   increased in a pattern of mild LVH. Systolic function was mildly   to moderately reduced. The estimated ejection fraction was in the   range of 40% to 45%. Diffuse hypokinesis. There is severe   hypokinesis of the basalinferior myocardium. Indeterminate   diastolic function. - Aortic valve: There was mild regurgitation. - Aorta: Aortic root dimension: 47 mm (ED). - Aortic root: The aortic root was moderately dilated. - Mitral valve: Mildly thickened leaflets. Mild prolapse, involving   the anterior leaflet. There was severe regurgitation directed   posteriorly. Effective regurgitant orifice (PISA): 0.41 cm^2.   Regurgitant volume (PISA): 79 ml. - Left atrium: The atrium was severely dilated. - Right ventricle: Systolic function was mildly reduced. - Right atrium: Central venous pressure (est): 3 mm Hg. - Atrial septum: No defect or patent foramen ovale was identified. - Tricuspid valve: There was mild regurgitation. - Pulmonary arteries: PA peak pressure: 43 mm Hg (S). - Pericardium, extracardiac: There was no pericardial effusion.  Assessment and Plan  1. PAF - CHADS2Vasc score is 6, continue anticoagulation.  - no symptoms, continue current meds - EKG shows rate controlled afib today  2. Chronic systolic HF - prior orthostatic symptoms limit medical therapy - significant LVEF improvement by recent echo, in setting of severe MR his true LVEF is lower than what is measured by echo.  - no symptoms, continue current meds  3. Mitral regurgitation - poor candidate for any form of MV repair or replacement given advanced age, comorbidities, thymoma. He also is asymptomatic with improving LVEF. If in the future ran into significant symptoms could at least have a conversation about a mitraclip. In absence of symptoms with improving LVEF would not consider at this time.  4. Aortic aneurysm - continue to monitor, would be a poor  candidate for any intervention if he ever reaced 5.5 cm.   F/u 6 months  Arnoldo Lenis, M.D.

## 2018-03-13 ENCOUNTER — Telehealth: Payer: Self-pay | Admitting: Cardiology

## 2018-03-13 MED ORDER — SACUBITRIL-VALSARTAN 24-26 MG PO TABS: 1.0000 | ORAL_TABLET | Freq: Two times a day (BID) | ORAL | 0 refills | Status: DC

## 2018-03-13 NOTE — Telephone Encounter (Signed)
His father is needing Entresto samples will run out this coming Wednesday.

## 2018-03-13 NOTE — Telephone Encounter (Signed)
Son advised that entresto samples are available for pick up.

## 2018-04-14 ENCOUNTER — Other Ambulatory Visit: Payer: Self-pay | Admitting: Hematology

## 2018-04-14 ENCOUNTER — Telehealth: Payer: Self-pay | Admitting: *Deleted

## 2018-04-14 ENCOUNTER — Encounter: Payer: Self-pay | Admitting: *Deleted

## 2018-04-14 NOTE — Telephone Encounter (Signed)
Patient needs to discuss with his pcp, as his cardiolgoist I would not be involved with any management of abdominal pain or liver issues   J Oretta Berkland MD

## 2018-04-14 NOTE — Telephone Encounter (Signed)
Dr. Irene Limbo was given information from first call. Son called back - states father is going to see PCP on the 3rd. Was told in ED that aneurysm is the major problem and that if father experiences any pain, he should go to ED immediately. Son was made aware that father has appt with Dr. Irene Limbo 1/24. Advised him to share that with PCP and if PCP advised earlier appt with Dr. Irene Limbo to have PCP contact office. Verbalized understanding.

## 2018-04-14 NOTE — Telephone Encounter (Signed)
Son informed and verbalized understanding  

## 2018-04-14 NOTE — Telephone Encounter (Signed)
Per son, patient was taken to [facility] on yesterday for abdominal pain. Son says that patient was d/c and told to f/u with cardiologist for abdominal pain. Per son, patient was diagnosed with having a cyst on his liver by Dr. Harl Bowie and wanted to know if he could be treated by Dr. Harl Bowie for the pain. Son advised that he would need to contact his PCP first and that his records would be requested for Dr. Harl Bowie to review upon return to the office on Monday. Son says he wanted Dr. Harl Bowie to be aware of what was going on with patient.

## 2018-04-14 NOTE — Telephone Encounter (Signed)
Received voice mail from patient: Martin Majestic to ED in [place] last night. His aortic aneurysm has gotten bigger. They also saw a hepatic cyst. Wants Dr. Irene Limbo to know.  Contacted patient at number provided for call back: [phone]. Left VM for patient that will call back later.

## 2018-05-16 ENCOUNTER — Other Ambulatory Visit: Payer: Self-pay | Admitting: Cardiology

## 2018-05-18 NOTE — Progress Notes (Signed)
[name]    HEMATOLOGY/ONCOLOGY CLINIC NOTE  Date of Service: 05/19/2018  Patient Care Team: Lonia Mad, MD as PCP - General (Internal Medicine) Harl Bowie Alphonse Guild, MD as PCP - Cardiology (Cardiology)  Surgeon Dr. Valentino Saxon  Cardiology : Dr.Branch  Son - Seanpatrick Maisano -[phone]    CHIEF COMPLAINTS/PURPOSE OF CONSULTATION:  Abdominal Carcinoid tumor Newly diagnosed thymoma  HISTORY OF PRESENTING ILLNESS:   Thunder [place]. is a wonderful 85 y.o. male who has been referred to [place] by Dr Tressie Stalker for continued care for his abdominal carcinoid .   patient presented in early March 2017 with abdominal pain, 30 pound weight loss over 6 months and increasing indigestion and abdominal pain. He also had nausea and vomiting. Patient was admitted to the hospital and had a CT of the abdomen that showed possible tumor in the mesentery. He was taken to surgery by Dr. Anthony Sar on 06/27/2015 and had resection of all visible tumor. He had partial small bowel resection a lymph node biopsy and he was found to have a tumor implant in the mesenteric tissue. The primary was 1.1 x 0.5-0.4 cm, he had one lymph node found to be involved and he had a 2.0 cm tumoral implant in the mesenteric tissue which was apparently resected. Patient had no symptoms of carcinoid syndrome.  He was seen in clinic on 07/21/2015 by Dr. Tressie Stalker at [facility] in [place], [place] and baseline labs were done at the time. CBC was noted to be normal with no anemia. CMP was within normal limits with no liver function abnormalities.  The actual pathology report and the baseline lab results were not available to [place].  Patient notes he healed well from his abdominal surgery. He has been taking in short and his oral appetite has improved and he feels that he is gaining back some weight. He notes that at its lowest his weight was 135 pounds and he is back up to 157.4 pounds now. Patient reports that his good baseline is 170 pounds.  Patient has  significant systolic CHF and follows up with cardiology he had a recent echocardiogram 10/20/2015 that showed left ventricular chamber size is moderately dilated, moderate concentric LVH with severely reduced left ventricular systolic function with an estimated ejection fraction of 20-25% . Also noted to have moderate to severe mitral regurgitation and moderate aortic regurgitation.  He presented to [facility] with acute dyspnea on 10/20/2015 and had a CTA of the chest with contrast that was negative for pulmonary embolism but an anterior mediastinal mass measuring 4.4 x 6.5 x 5.4 cm was noted with a few mildly prominent mediastinal lymph nodes measuring 1.5 cm and a mildly prominent right hilar lymph node. Considerations were lymphoma versus teratoma versus thymoma. Liver lesions were also apparently noted. No significant skeletal lesions noted. Bilateral pleural effusions right greater than left.  Prior to this the patient had a CT of the abdomen and pelvis with contrast on 10/18/2015 at [facility] for evaluation of generalized abdominal pain intermittently for the last 3 months. This showed postoperative changes in the abdomen with evidence of a partial small bowel resection and removal of the mediastinal mass. Small lymph nodes in the mesentery. Numerous low-density structures in the liver which probably represent cysts. Index lesion in the posterior left hepatic lobe measures 2.4 cm and stable. Noted to have gallbladder distention with some new pericholecystic fluid.  The CT scan result was provided to [place] after the patient's clinical visit. Images were requested and disc.  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 The patient's last visit with [place] was on 01/20/18. He is accompanied today by his two sons. The pt reports that he is doing well overall.   The pt reports that he had some discomfort in his chest, which he describes  as "nervousness in his chest," for which he presented to the ED a couple weeks ago. 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 He also discussed hospice considerations at the ED. The pt notes that he will be seeing cardiothoracic surgeon Dr. Tonye Royalty in follow up soon. The pt will be following up with his PCP Dr. Calton Dach next week on 05/22/18 as well.  The pt denies feeling that his heart is racing. He notes that he has some discomfort in his chest occasionally.  Otherwise, the pt denies any other concerns at this time. He denies constitutional symptoms, leg swelling, or abdominal pains.   Lab results today (05/19/18) of CBC w/diff and CMP is as follows: all values are WNL except for WBC at 2.7k, RBC at 4.18, HGB at 12.6, PLT at 119k, Lymphs abs at 600, Glucose at 102, Creatinine at 1.32, GFR at 57.  On review of systems, pt reports occasional chest discomfort, stable energy levels, and denies back pains, sense of racing heart, belly pains, problems passing urine, leg swelling, and any other symptoms.    MEDICAL HISTORY:  Past Medical History:  Diagnosis Date  . Atrial fibrillation, permanent   . Chronic anemia   . Chronic leukopenia   . Congestive heart failure (CHF) ([place])   . Diabetes ([place])   . GERD (gastroesophageal reflux disease)   . Hyperlipidemia   . Hypertension   . 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 CARDIAC CATHETERIZATION N/A 12/03/2015   Procedure: Right/Left Heart Cath and Coronary Angiography;  Surgeon: Peter M Martinique, MD;  Location: [place] [facility];  Service: Cardiovascular;  Laterality: N/A;    . PERIPHERAL VASCULAR CATHETERIZATION N/A 12/03/2015   Procedure: Thoracic Aortogram;  Surgeon: Peter M Martinique, MD;  Location: [place] [facility];  Service: Cardiovascular;  Laterality: N/A;    SOCIAL HISTORY: Social History   Socioeconomic History  . Marital status: Widowed    Spouse name: Not on file  . Number of children: Not on file  . Years of education: Not on file  . Highest education level: Not on file  Occupational History  . Not on file  Social Needs  . Financial resource strain: Not on file  . Food insecurity:    Worry: Not on file    Inability: Not on file  . Transportation needs:    Medical: Not on file    Non-medical: Not on file  Tobacco Use  . Smoking status: Former Smoker    Packs/day: 0.50    Years: 46.00    Pack years: 23.00    Types: Cigarettes    Start date: 10/27/1949    Last attempt to quit: 04/27/1995    Years since quitting: 23.0  . Smokeless tobacco: Never Used  Substance and Sexual  Activity  . Alcohol use: Never    Alcohol/week: 0.0 standard drinks    Frequency: Never  . Drug use: Never  . Sexual activity: Not on file  Lifestyle  . Physical activity:    Days per week: Not on file    Minutes per session: Not on file  . Stress: Not on file  Relationships  . Social connections:    Talks on phone: Not on file    Gets together: Not on file    Attends religious service: Not on file    Active member of club or organization: Not on file    Attends meetings of clubs or organizations: Not on file    Relationship status: Not on file  . Intimate partner violence:    Fear of current or ex partner: Not on file    Emotionally abused: Not on file    Physically abused: Not on file    Forced sexual activity: Not on file  Other Topics Concern  . Not on file  Social History Narrative  . Not on file    FAMILY HISTORY: Family History  Family history unknown: Yes  Patient denies any family history of cancers   ALLERGIES:  has No Known  Allergies.  MEDICATIONS:  Current Outpatient Medications  Medication Sig Dispense Refill  . allopurinol (ZYLOPRIM) 300 MG tablet Take 300 mg by mouth daily.     [name] apixaban (ELIQUIS) 5 MG TABS tablet Take 5 mg by mouth 2 (two) times daily.    [name] docusate sodium (COLACE) 100 MG capsule Take 100-200 mg by mouth daily as needed for mild constipation.    . folic acid (FOLVITE) 1 MG tablet Take 1 mg by mouth daily.     . furosemide (LASIX) 20 MG tablet Take 20 mg by mouth as needed.    [name] ibuprofen (ADVIL,MOTRIN) 200 MG tablet Take 200 mg by mouth every 6 (six) hours as needed.    . isosorbide mononitrate (IMDUR) 30 MG 24 hr tablet Take 30 mg by mouth every morning.     . magnesium oxide (MAG-OX) 400 MG tablet Take 400 mg by mouth 2 (two) times daily.    . metoprolol succinate (TOPROL-XL) 25 MG 24 hr tablet TAKE 1 TABLET BY MOUTH TWICE DAILY 180 tablet 2  . omeprazole (PRILOSEC) 40 MG capsule Take 40 mg by mouth daily.     . potassium chloride SA (K-DUR,KLOR-CON) 20 MEQ tablet Take 20 mEq by mouth daily as needed.     . sacubitril-valsartan (ENTRESTO) 24-26 MG Take 1 tablet by mouth 2 (two) times daily. 28 tablet 0   No current facility-administered medications for this visit.     REVIEW OF SYSTEMS:    A 10+ POINT REVIEW OF SYSTEMS WAS OBTAINED including neurology, dermatology, psychiatry, cardiac, respiratory, lymph, extremities, GI, GU, Musculoskeletal, constitutional, breasts, reproductive, HEENT.  All pertinent positives are noted in the HPI.  All others are negative.   PHYSICAL EXAMINATION:  ECOG PERFORMANCE STATUS: 2 - Symptomatic, <50% confined to bed  . Vitals:   05/19/18 1144  BP: 117/80  Pulse: 70  Resp: 20  Temp: (!) 97.5 F (36.4 C)  SpO2: 100%   Filed Weights   05/19/18 1144  Weight: 163 lb 12.8 oz (74.3 kg)   .Body mass index is 22.85 kg/m.  GENERAL:alert, in no acute distress and comfortable SKIN: no acute rashes, no significant lesions EYES: conjunctiva are pink  and non-injected, sclera anicteric OROPHARYNX: MMM, no exudates, no oropharyngeal erythema or ulceration  NECK: supple, no JVD LYMPH:  no palpable lymphadenopathy in the cervical, axillary or inguinal regions LUNGS: clear to auscultation b/l with normal respiratory effort HEART: regular rate & rhythm ABDOMEN:  normoactive bowel sounds , non tender, not distended. No palpable hepatosplenomegaly.  Extremity: no pedal edema PSYCH: alert & oriented x 3 with fluent speech NEURO: no focal motor/sensory deficits   LABORATORY DATA:  I have reviewed the data as listed  . CBC Latest Ref Rng & Units 05/19/2018 01/20/2018 08/29/2017  WBC 4.0 - 10.5 K/uL 2.7(L) 2.7(L) 2.6(L)  Hemoglobin 13.0 - 17.0 g/dL 12.6(L) 11.9(L) 12.7(L)  Hematocrit 39.0 - 52.0 % 39.8 37.1(L) 39.2  Platelets 150 - 400 K/uL 119(L) 129(L) 136(L)   . CBC    Component Value Date/Time   WBC 2.7 (L) 05/19/2018 1044   RBC 4.18 (L) 05/19/2018 1044   HGB 12.6 (L) 05/19/2018 1044   HGB 12.7 (L) 08/29/2017 1108   HGB 12.0 (L) 02/03/2016 1404   HCT 39.8 05/19/2018 1044   HCT 36.2 (L) 02/03/2016 1404   PLT 119 (L) 05/19/2018 1044   PLT 136 (L) 08/29/2017 1108   PLT 99 (L) 02/03/2016 1404   MCV 95.2 05/19/2018 1044   MCV 85.2 02/03/2016 1404   MCH 30.1 05/19/2018 1044   MCHC 31.7 05/19/2018 1044   RDW 14.3 05/19/2018 1044   RDW 16.6 (H) 02/03/2016 1404   LYMPHSABS 0.6 (L) 05/19/2018 1044   LYMPHSABS 0.7 (L) 02/03/2016 1404   MONOABS 0.2 05/19/2018 1044   MONOABS 0.3 02/03/2016 1404   EOSABS 0.0 05/19/2018 1044   EOSABS 0.1 02/03/2016 1404   BASOSABS 0.0 05/19/2018 1044   BASOSABS 0.0 02/03/2016 1404   . CMP Latest Ref Rng & Units 05/19/2018 01/20/2018 08/29/2017  Glucose 70 - 99 mg/dL 102(H) 94 88  BUN 8 - 23 mg/dL 12 12 16   Creatinine 0.61 - 1.24 mg/dL 1.32(H) 1.26(H) 1.41(H)  Sodium 135 - 145 mmol/L 137 139 135(L)  Potassium 3.5 - 5.1 mmol/L 4.6 4.2 4.8  Chloride 98 - 111 mmol/L 107 109 106  CO2 22 - 32 mmol/L 23 22 25    Calcium 8.9 - 10.3 mg/dL 9.2 8.7(L) 9.4  Total Protein 6.5 - 8.1 g/dL 7.4 7.0 7.4  Total Bilirubin 0.3 - 1.2 mg/dL 0.7 0.5 0.5  Alkaline Phos 38 - 126 U/L 73 74 79  AST 15 - 41 U/L 15 12(L) 13  ALT 0 - 44 U/L 9 6 <6   . Component     Latest Ref Rng & Units 02/03/2016 08/29/2017  Chromogranin A     0 - 5 nmol/L 9 (H)   Chromogranin A     0 - 5 nmol/L  10 (H)      RADIOGRAPHIC STUDIES: I have personally reviewed the radiological images as listed and agreed with the findings in the report. Restaging PET, 08/17/2017 IMPRESSION:  1. Interval enlargement of anterior mediastinal mass compared with prior PET-CT 11/24/2015. The degree of hypermetabolic activity associated with this mass is similar to the previous study. 2. Two new hypermetabolic right axillary lymph nodes, indeterminate in etiology. This would be an atypical pattern for isolated metastatic disease from the patient's thymoma. Low-level activity within mediastinal lymph nodes is similar to the previous study. 3. Persistent hypermetabolic polypoid lesion in the left maxillary sinus. This could reflect a polyp, indolent neoplasm or chronic inflammation. 4. Grossly stable incidental findings including hepatic and renal cysts and diffuse Aortic Atherosclerosis (ICD10-I70.0).   PET/CT 11/24/2015: IMPRESSION: 4.2 x 6.6 cm  hypermetabolic anterior mediastinal mass. Primary differential considerations include lymphoma or thymoma.  10 mm short axis hypermetabolic right axillary node, raising the possibility of nodal metastasis. Additional mediastinal nodes do not demonstrate appreciable hypermetabolism.  2.2 x 1.9 cm hypermetabolic polypoid lesion along the medial wall of the left maxillary sinus, possibly reflecting a benign or malignant maxillary sinus polyp. Metastatic disease to this location would be considered unusual. Consider maxillofacial MRI with/without contrast for further characterization as clinically  warranted.   Electronically Signed   By: Julian Hy M.D.   On: 11/24/2015 17:24   ASSESSMENT & PLAN:   85 y.o. African-American male with multiple medical comorbidities and severe systolic CHF with  1) ?Stage III small bowel carcinoid status post resection by Dr. Anthony Sar on 06/27/2015 at [place] [facility]. Noted to have one positive lymph node and extramural nodule as per outside report. Chromogranin level borderline elevated to 9 , nl serotonin. No overt evidence of recurrent neuro-endocrine tumor at this time. Restaging PET scan on 08/17/2017 showed: Interval enlargement of anterior mediastinal mass compared with prior PET-CT 11/24/2015. The degree of hypermetabolic activity associated with this mass is similar to the previous study. Two new hypermetabolic right axillary lymph nodes, indeterminate in etiology. This would be an atypical pattern for isolated metastatic disease from the patient's tymoma. Low-level activity within mediastinal lymph nodes is similar to the previous study. Persistent hypermetabolic polypoid lesion in the left maxillary sinus. This could reflect a polyp, indolent neoplasm or chronic inflammation. Grossly stable incidental findings including hepatic and renal cysts and diffuse Aortic Atherosclerosis (ICD10-I70.0).  2) anterior mediastinal mass -4.4 x 6.5 x 5.4 cm was noted with a few mildly prominent mediastinal lymph nodes. Noted to FDG avid on PET/CT scan. CT guided biopsy consistent with thymoma. This was incidentally noted on imaging has remained asymptomatic . No overt associated paraneoplastic presentation at this time.  PLAN -Discussed pt labwork today, 05/19/18; HGB improved to 12.6, WBC at 2.7k, PLT at 119k, chemistries stable. LDH is pending.  -05/19/18 Chromogranin A is pending.  -Follow up with Cardiothoracic surgeon Dr. Roxan Hockey, Dr. Harl Bowie and Dr. Calton Dach for consideration of aneurysm operation  -Will obtain report of recent CT scan done at  [facility] hospital hospital to monitor thymoma  -The pt shows no overt clinical or lab progression of neuro-endocrine tumor or thymoma at this time.  -No indication for initiating active treatment at this time. -Pt's preference in his goals of care continue to be less aggressive approaches, and would only like repeat images if his symptoms indicate the necessity -Will see the pt back in 6 months    3) thrombocytopenia mild PLT 119k. No evidence of bleeding - monitor  Plan -Will continue with surveillance of his tumor with routine CT scans   RTC with Dr Irene Limbo with labs in 6 months   All of the patients questions were answered with apparent satisfaction. The patient knows to call the clinic with any problems, questions or concerns.  The total time spent in the appt was 25 minutes and more than 50% was on counseling and direct patient cares.    Sullivan Lone MD [place] AAHIVMS [facility] Hematology/Oncology Physician [facility]  (Office):       [phone] (Work cell):  [phone] (Fax):           [phone]  I, Baldwin [place], am acting as a scribe for Dr. Sullivan Lone.   .I have reviewed the above documentation for accuracy and completeness, and I agree  with the above. Brunetta Genera MD

## 2018-05-19 ENCOUNTER — Inpatient Hospital Stay: Payer: Medicare FFS

## 2018-05-19 ENCOUNTER — Inpatient Hospital Stay: Payer: Medicare FFS | Attending: Hematology | Admitting: Hematology

## 2018-05-19 ENCOUNTER — Telehealth: Payer: Self-pay | Admitting: Hematology

## 2018-05-19 ENCOUNTER — Telehealth: Payer: Self-pay | Admitting: *Deleted

## 2018-05-19 VITALS — BP 117/80 | HR 70 | Temp 97.5°F | Resp 20 | Ht 71.0 in | Wt 163.8 lb

## 2018-05-19 DIAGNOSIS — Z79899 Other long term (current) drug therapy: Secondary | ICD-10-CM | POA: Diagnosis not present

## 2018-05-19 DIAGNOSIS — C7A098 Malignant carcinoid tumors of other sites: Secondary | ICD-10-CM | POA: Diagnosis not present

## 2018-05-19 DIAGNOSIS — Z85030 Personal history of malignant carcinoid tumor of large intestine: Secondary | ICD-10-CM

## 2018-05-19 DIAGNOSIS — I10 Essential (primary) hypertension: Secondary | ICD-10-CM

## 2018-05-19 DIAGNOSIS — D3A019 Benign carcinoid tumor of the small intestine, unspecified portion: Secondary | ICD-10-CM

## 2018-05-19 DIAGNOSIS — D150 Benign neoplasm of thymus: Secondary | ICD-10-CM | POA: Insufficient documentation

## 2018-05-19 DIAGNOSIS — D4989 Neoplasm of unspecified behavior of other specified sites: Secondary | ICD-10-CM

## 2018-05-19 DIAGNOSIS — Z87891 Personal history of nicotine dependence: Secondary | ICD-10-CM | POA: Insufficient documentation

## 2018-05-19 DIAGNOSIS — C7A00 Malignant carcinoid tumor of unspecified site: Secondary | ICD-10-CM

## 2018-05-19 DIAGNOSIS — C7B09 Secondary carcinoid tumors of other sites: Secondary | ICD-10-CM

## 2018-05-19 DIAGNOSIS — D696 Thrombocytopenia, unspecified: Secondary | ICD-10-CM | POA: Insufficient documentation

## 2018-05-19 LAB — CMP (CANCER CENTER ONLY)
ALBUMIN: 4 g/dL (ref 3.5–5.0)
ALT: 9 U/L (ref 0–44)
AST: 15 U/L (ref 15–41)
Alkaline Phosphatase: 73 U/L (ref 38–126)
Anion gap: 7 (ref 5–15)
BUN: 12 mg/dL (ref 8–23)
CALCIUM: 9.2 mg/dL (ref 8.9–10.3)
CHLORIDE: 107 mmol/L (ref 98–111)
CO2: 23 mmol/L (ref 22–32)
CREATININE: 1.32 mg/dL — AB (ref 0.61–1.24)
GFR, EST NON AFRICAN AMERICAN: 50 mL/min — AB (ref >60)
GFR, Est AFR Am: 57 mL/min — ABNORMAL LOW (ref >60)
Glucose, Bld: 102 mg/dL — ABNORMAL HIGH (ref 70–99)
Potassium: 4.6 mmol/L (ref 3.5–5.1)
Sodium: 137 mmol/L (ref 135–145)
TOTAL PROTEIN: 7.4 g/dL (ref 6.5–8.1)
Total Bilirubin: 0.7 mg/dL (ref 0.3–1.2)

## 2018-05-19 LAB — CBC WITH DIFFERENTIAL/PLATELET
Abs Immature Granulocytes: 0.01 10*3/uL (ref 0.00–0.07)
BASOS ABS: 0 10*3/uL (ref 0.0–0.1)
BASOS PCT: 0 %
EOS ABS: 0 10*3/uL (ref 0.0–0.5)
EOS PCT: 2 %
HEMATOCRIT: 39.8 % (ref 39.0–52.0)
Hemoglobin: 12.6 g/dL — ABNORMAL LOW (ref 13.0–17.0)
Immature Granulocytes: 0 %
LYMPHS ABS: 0.6 10*3/uL — AB (ref 0.7–4.0)
Lymphocytes Relative: 24 %
MCH: 30.1 pg (ref 26.0–34.0)
MCHC: 31.7 g/dL (ref 30.0–36.0)
MCV: 95.2 fL (ref 80.0–100.0)
Monocytes Absolute: 0.2 10*3/uL (ref 0.1–1.0)
Monocytes Relative: 8 %
NRBC: 0 % (ref 0.0–0.2)
Neutro Abs: 1.8 10*3/uL (ref 1.7–7.7)
Neutrophils Relative %: 66 %
PLATELETS: 119 10*3/uL — AB (ref 150–400)
RBC: 4.18 MIL/uL — ABNORMAL LOW (ref 4.22–5.81)
RDW: 14.3 % (ref 11.5–15.5)
WBC: 2.7 10*3/uL — AB (ref 4.0–10.5)

## 2018-05-19 LAB — LACTATE DEHYDROGENASE: LDH: 210 U/L — AB (ref 98–192)

## 2018-05-19 NOTE — Telephone Encounter (Signed)
Scheduled appt per 01/24 los. ° °Printed calendar and avs. °

## 2018-05-19 NOTE — Telephone Encounter (Signed)
Requested results of CT scans performed at [facility] in [place], [place] 05/07/2018 at Dr. Grier Mitts request.  Faxed request to medical records [phone]  fax confirmation received.

## 2018-05-19 NOTE — Patient Instructions (Signed)
Thank you for choosing [facility] to provide your oncology and hematology care.  To afford each patient quality time with our providers, please arrive 30 minutes before your scheduled appointment time.  If you arrive late for your appointment, you may be asked to reschedule.  We strive to give you quality time with our providers, and arriving late affects you and other patients whose appointments are after yours.    If you are a no show for multiple scheduled visits, you may be dismissed from the clinic at the providers discretion.     Again, thank you for choosing [facility], our hope is that these requests will decrease the amount of time that you wait before being seen by our physicians.  ______________________________________________________________________   Should you have questions after your visit to the [facility], please contact our office at [phone] between the hours of 8:30 and 4:30 p.m.    Voicemails left after 4:30p.m will not be returned until the following business day.     For prescription refill requests, please have your pharmacy contact us directly.  Please also try to allow 48 hours for prescription requests.     Please contact the scheduling department for questions regarding scheduling.  For scheduling of procedures such as PET scans, CT scans, MRI, Ultrasound, etc please contact central scheduling at [phone].     Resources For Cancer Patients and Caregivers:    Oncolink.org:  A wonderful resource for patients and healthcare providers for information regarding your disease, ways to tract your treatment, what to expect, etc.      American Cancer Society:  800-227-2345  Can help patients locate various types of support and financial assistance   Cancer Care: 1-800-813-HOPE (4673) Provides financial assistance, online support groups, medication/co-pay assistance.     [place] DSS:  336-641-3447 Where to apply  for food stamps, Medicaid, and utility assistance   Medicare Rights Center: 800-333-4114 Helps people with Medicare understand their rights and benefits, navigate the Medicare system, and secure the quality healthcare they deserve   SCAT: 336-333-6589 [place] [employer]'s shared-ride transportation service for eligible riders who have a disability that prevents them from riding the fixed route bus.     For additional information on assistance programs please contact our social worker:   Abigail Elmore:  [phone]  

## 2018-05-22 LAB — CHROMOGRANIN A: Chromogranin A (ng/mL): 457 ng/mL — ABNORMAL HIGH (ref 0.0–101.8)

## 2018-06-14 ENCOUNTER — Other Ambulatory Visit: Payer: Self-pay | Admitting: Hematology

## 2018-06-14 DIAGNOSIS — D3A019 Benign carcinoid tumor of the small intestine, unspecified portion: Secondary | ICD-10-CM

## 2018-06-15 ENCOUNTER — Encounter: Payer: Self-pay | Admitting: Cardiology

## 2018-06-16 ENCOUNTER — Telehealth: Payer: Self-pay | Admitting: *Deleted

## 2018-06-16 NOTE — Telephone Encounter (Signed)
Contacted patient per Dr. Grier Mitts instructions to inform patient that test results show  tumor markers are increasing. Dr. Irene Limbo reviewed CT results from [facility]. Patient currently has f/u appt in July - Dr. Irene Limbo would like to see him near end of April with labs.  Spoke with son Marchia Bond (on [place]). Patient had ECHO yesterday. Per son, patient would like to have surgery to have tumor removed and that's what they thought Dr. Irene Limbo was working on. He wondered if his father should be seen sooner than April? Advised them that Dr. Irene Limbo will be given information and office will contact them regarding scheduling of next appt.

## 2018-06-23 ENCOUNTER — Encounter: Payer: Self-pay | Admitting: Cardiology

## 2018-07-05 ENCOUNTER — Encounter: Payer: Self-pay | Admitting: Internal Medicine

## 2018-07-05 DIAGNOSIS — I719 Aortic aneurysm of unspecified site, without rupture: Secondary | ICD-10-CM | POA: Insufficient documentation

## 2018-07-05 DIAGNOSIS — I5022 Chronic systolic (congestive) heart failure: Secondary | ICD-10-CM | POA: Insufficient documentation

## 2018-07-05 DIAGNOSIS — I340 Nonrheumatic mitral (valve) insufficiency: Secondary | ICD-10-CM | POA: Insufficient documentation

## 2018-07-05 NOTE — Progress Notes (Deleted)
Cardiology Office Note    Date:  07/05/2018   ID:  Thomas Garner., DOB 1932/09/27, MRN [id number]  PCP:  Lonia Mad, MD  Cardiologist: Carlyle Dolly, MD EPS: None  No chief complaint on file.   History of Present Illness:  Thomas Skog. is a 85 y.o. male with history of longstanding A. fib on Eliquis chads BASC equals 6, history of GI bleed at [facility] in 2010 transfused 2 units, GI bleed in 2012 with negative endoscopy and colonoscopy and bleeding scan was negative.  These all occurred on Coumadin.  He is tolerated Eliquis.  History of systolic CHF with mild nonobstructive CAD on cath 11/2015 echo at [facility] 04/2016 LVEF 25 to 30% with mild RV dysfunction severe MR moderate TR restrictive diastolic function.  Follow-up echo 09/2017 LVEF 40 to 45% with severe MR and prolapse of the anterior leaflet with severe posterior MR, aortic aneurysm mildly dilated a sending aorta 4.2 cm in 2017, aortic root 4.7 cm on echo 09/2017-felt to be a poor surgical candidate for any intervention.   Patient last saw Dr. Harl Bowie 02/2018 at which time he was asymptomatic.  He was felt to be a poor candidate for any form of mitral valve repair or replacement given advanced age, core morbidities, thymoma.  He was asymptomatic and had improving LV function.  If in the future he became significantly symptomatic could consider mitral clip.    Past Medical History:  Diagnosis Date  . Atrial fibrillation, permanent   . Chronic anemia   . Chronic leukopenia   . Congestive heart failure (CHF) ([place])   . Diabetes ([place])   . GERD (gastroesophageal reflux disease)   . Hyperlipidemia   . Hypertension   . Pancytopenia [facility])     Past Surgical History:  Procedure Laterality Date  . 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 CARDIAC CATHETERIZATION N/A 12/03/2015   Procedure: Right/Left Heart Cath and Coronary Angiography;  Surgeon: Peter M  Martinique, MD;  Location: [place] CV [facility];  Service: Cardiovascular;  Laterality: N/A;  . PERIPHERAL VASCULAR CATHETERIZATION N/A 12/03/2015   Procedure: Thoracic Aortogram;  Surgeon: Peter M Martinique, MD;  Location: [place] CV [facility];  Service: Cardiovascular;  Laterality: N/A;    Current Medications: No outpatient medications have been marked as taking for the 07/10/18 encounter (Appointment) with Imogene Burn, PA-C.     Allergies:   Patient has no known allergies.   Social History   Socioeconomic History  . Marital status: Widowed    Spouse name: Not on file  . Number of children: Not on file  . Years of education: Not on file  . Highest education level: Not on file  Occupational History  . Not on file  Social Needs  . Financial resource strain: Not on file  . Food insecurity:    Worry: Not on file    Inability: Not on file  . Transportation needs:    Medical: Not on file    Non-medical: Not on file  Tobacco Use  . Smoking status: Former Smoker    Packs/day: 0.50    Years: 46.00    Pack years: 23.00    Types: Cigarettes    Start date: 10/27/1949    Last attempt to quit: 04/27/1995    Years since quitting: 23.2  . Smokeless tobacco: Never Used  Substance and Sexual Activity  . Alcohol use: Never    Alcohol/week: 0.0  standard drinks    Frequency: Never  . Drug use: Never  . Sexual activity: Not on file  Lifestyle  . Physical activity:    Days per week: Not on file    Minutes per session: Not on file  . Stress: Not on file  Relationships  . Social connections:    Talks on phone: Not on file    Gets together: Not on file    Attends religious service: Not on file    Active member of club or organization: Not on file    Attends meetings of clubs or organizations: Not on file    Relationship status: Not on file  Other Topics Concern  . Not on file  Social History Narrative  . Not on file     Family History:  The patient's ***Family history is unknown by  patient.   ROS:   Please see the history of present illness.    ROS All other systems reviewed and are negative.   PHYSICAL EXAM:   VS:  There were no vitals taken for this visit.  Physical Exam  GEN: Well nourished, well developed, in no acute distress  HEENT: normal  Neck: no JVD, carotid bruits, or masses Cardiac:RRR; no murmurs, rubs, or gallops  Respiratory:  clear to auscultation bilaterally, normal work of breathing GI: soft, nontender, nondistended, + BS Ext: without cyanosis, clubbing, or edema, Good distal pulses bilaterally MS: no deformity or atrophy  Skin: warm and dry, no rash Neuro:  Alert and Oriented x 3, Strength and sensation are intact Psych: euthymic mood, full affect  Wt Readings from Last 3 Encounters:  05/19/18 163 lb 12.8 oz (74.3 kg)  03/07/18 167 lb (75.8 kg)  01/20/18 163 lb 6.4 oz (74.1 kg)      Studies/Labs Reviewed:   EKG:  EKG is*** ordered today.  The ekg ordered today demonstrates ***  Recent Labs: 05/19/2018: ALT 9; BUN 12; Creatinine 1.32; Hemoglobin 12.6; Platelets 119; Potassium 4.6; Sodium 137   Lipid Panel No results found for: CHOL, TRIG, HDL, CHOLHDL, VLDL, LDLCALC, LDLDIRECT  Additional studies/ records that were reviewed today include:  11/2015 LHC/RHC  Prox LAD to Mid LAD lesion, 20 %stenosed.  There is severe left ventricular systolic dysfunction.  The left ventricular ejection fraction is less than 25% by visual estimate.  There is mild (2+) mitral regurgitation.  LV end diastolic pressure is normal.  LV end diastolic pressure is normal.   1. Minor nonobstructive CAD 2. Severe LV enlargement with markedly reduced LV systolic function. 3. Moderate dilation of proximal aorta. 4. Normal right heart pressures, normal LV filling pressures.  5. Mild MR  Aortic Root: The aortic root displays moderate dilation. Plan: medical management.      Jan 2018 Echo [facility] LVEF 25-30%, severe MR.      09/2017 echo  Study Conclusions   - Left ventricle: The cavity size was normal. Wall thickness was   increased in a pattern of mild LVH. Systolic function was mildly   to moderately reduced. The estimated ejection fraction was in the   range of 40% to 45%. Diffuse hypokinesis. There is severe   hypokinesis of the basalinferior myocardium. Indeterminate   diastolic function. - Aortic valve: There was mild regurgitation. - Aorta: Aortic root dimension: 47 mm (ED). - Aortic root: The aortic root was moderately dilated. - Mitral valve: Mildly thickened leaflets. Mild prolapse, involving   the anterior leaflet. There was severe regurgitation directed   posteriorly. Effective regurgitant  orifice (PISA): 0.41 cm^2.   Regurgitant volume (PISA): 79 ml. - Left atrium: The atrium was severely dilated. - Right ventricle: Systolic function was mildly reduced. - Right atrium: Central venous pressure (est): 3 mm Hg. - Atrial septum: No defect or patent foramen ovale was identified. - Tricuspid valve: There was mild regurgitation. - Pulmonary arteries: PA peak pressure: 43 mm Hg (S). - Pericardium, extracardiac: There was no pericardial effusion.   CTA 05/07/2018 [place] review of CT 05/07/2018 mild aneurysmal dilatation aortic root maximum diameter 5 cm ascending thoracic aorta ectatic bordering on aneurysmal at 3.8 cm  2D echo 6/13/2019Study Conclusions   - Left ventricle: The cavity size was normal. Wall thickness was   increased in a pattern of mild LVH. Systolic function was mildly   to moderately reduced. The estimated ejection fraction was in the   range of 40% to 45%. Diffuse hypokinesis. There is severe   hypokinesis of the basalinferior myocardium. Indeterminate   diastolic function. - Aortic valve: There was mild regurgitation. - Aorta: Aortic root dimension: 47 mm (ED). - Aortic root: The aortic root was moderately dilated. - Mitral valve: Mildly thickened leaflets. Mild prolapse, involving    the anterior leaflet. There was severe regurgitation directed   posteriorly. Effective regurgitant orifice (PISA): 0.41 cm^2.   Regurgitant volume (PISA): 79 ml. - Left atrium: The atrium was severely dilated. - Right ventricle: Systolic function was mildly reduced. - Right atrium: Central venous pressure (est): 3 mm Hg. - Atrial septum: No defect or patent foramen ovale was identified. - Tricuspid valve: There was mild regurgitation. - Pulmonary arteries: PA peak pressure: 43 mm Hg (S). - Pericardium, extracardiac: There was no pericardial effusion.    ASSESSMENT:    1. Paroxysmal atrial fibrillation (HCC)   2. Chronic systolic CHF (congestive heart failure) ([place])   3. Severe mitral regurgitation   4. Aortic aneurysm without rupture, unspecified portion of aorta ([place])   5. Essential hypertension   6. Hyperlipidemia, unspecified hyperlipidemia type      PLAN:  In order of problems listed above:  Permanent atrial fibrillation chads BASC equals 6 on Eliquis  Chronic systolic CHF ejection fraction 40 to 45% on echo 09/2017 in the setting of severe MR asymptomatic  Severe mitral regurgitation not felt to be candidate for mitral valve repair or replacement given advanced age and comorbidities as well as thymoma.  If symptoms consider mitral clip  Aortic aneurysm review of CT 05/07/2018 mild aneurysmal dilatation aortic root maximum diameter 5 cm ascending thoracic aorta ectatic bordering on aneurysmal at 3.8 cm  Essential hypertension  Hyperlipidemia  Medication Adjustments/Labs and Tests Ordered: Current medicines are reviewed at length with the patient today.  Concerns regarding medicines are outlined above.  Medication changes, Labs and Tests ordered today are listed in the Patient Instructions below. There are no Patient Instructions on file for this visit.   Signed, Ermalinda Barrios, PA-C  07/05/2018 1:59 PM    [facility] [address], [place],  [place]  [address] Phone: [phone]; Fax: [phone]

## 2018-07-10 ENCOUNTER — Ambulatory Visit: Payer: Medicare FFS | Admitting: Physician Assistant

## 2018-07-18 ENCOUNTER — Telehealth: Payer: Self-pay | Admitting: Cardiology

## 2018-07-18 MED ORDER — SACUBITRIL-VALSARTAN 24-26 MG PO TABS: 1.0000 | ORAL_TABLET | Freq: Two times a day (BID) | ORAL | 0 refills | Status: DC

## 2018-07-18 NOTE — Telephone Encounter (Signed)
Pt requesting samples with mistake at the pharmacy and will not be mailed anymore Entresto for 90 days - pt will come for samples tomorrow

## 2018-07-18 NOTE — Telephone Encounter (Signed)
sacubitril-valsartan (ENTRESTO) 24-26 MG   Patients medication was sent back to pharmacy by error due to being at [place] sent back??  Wants to know if he can get samples

## 2018-08-09 ENCOUNTER — Telehealth: Payer: Self-pay | Admitting: Cardiology

## 2018-08-09 NOTE — Telephone Encounter (Signed)
Patient calling to see if we can give them samples Entresto PCP lost his Delene Loll and told him to contact us to see if we had samples to give

## 2018-08-09 NOTE — Telephone Encounter (Signed)
Pt aware that we do not have any samples in the office and unclear when we would get anymore with current pandemic. Pt understood and would call the [facility] pt assistance and get medications from pharmacy

## 2018-09-06 ENCOUNTER — Telehealth: Payer: Self-pay | Admitting: *Deleted

## 2018-09-06 NOTE — Telephone Encounter (Signed)
Pt verbalized consent for 09/11/18 telehealth appt with Dr Harl Bowie and that insurance would be billed for the encounter. Pt will have BP/HR/weight and meds available

## 2018-09-07 ENCOUNTER — Ambulatory Visit: Payer: Medicare FFS | Admitting: Cardiology

## 2018-09-11 ENCOUNTER — Telehealth (INDEPENDENT_AMBULATORY_CARE_PROVIDER_SITE_OTHER): Payer: Medicare FFS | Admitting: Cardiology

## 2018-09-11 ENCOUNTER — Encounter: Payer: Self-pay | Admitting: Cardiology

## 2018-09-11 VITALS — Ht 71.0 in

## 2018-09-11 DIAGNOSIS — I5022 Chronic systolic (congestive) heart failure: Secondary | ICD-10-CM | POA: Diagnosis not present

## 2018-09-11 DIAGNOSIS — I712 Thoracic aortic aneurysm, without rupture, unspecified: Secondary | ICD-10-CM

## 2018-09-11 DIAGNOSIS — I480 Paroxysmal atrial fibrillation: Secondary | ICD-10-CM

## 2018-09-11 DIAGNOSIS — I340 Nonrheumatic mitral (valve) insufficiency: Secondary | ICD-10-CM

## 2018-09-11 NOTE — Progress Notes (Signed)
Virtual Visit via Telephone Note   This visit type was conducted due to national recommendations for restrictions regarding the COVID-19 Pandemic (e.g. social distancing) in an effort to limit this patient's exposure and mitigate transmission in our community.  Due to his co-morbid illnesses, this patient is at least at moderate risk for complications without adequate follow up.  This format is felt to be most appropriate for this patient at this time.  The patient did not have access to video technology/had technical difficulties with video requiring transitioning to audio format only (telephone).  All issues noted in this document were discussed and addressed.  No physical exam could be performed with this format.  Please refer to the patient's chart for his  consent to telehealth for [facility].   Date:  09/11/2018   ID:  Thomas Costa., DOB 1932-05-19, MRN [id number]  Patient Location: Home Provider Location: Home  PCP:  Lonia Mad, MD  Cardiologist:  Carlyle Dolly, MD  Electrophysiologist:  None   Evaluation Performed:  Follow up  Chief Complaint:  6 month f/u  History of Present Illness:    Thomas Costa. is a 85 y.o. male seen today for follow up of the following medical problems.   1. Afib - long history of afib according to notes - recent afib with RVR during 09/2015 admission at [facility] - previous hematemsis when on anticoagulant in the remote past. - mention of sick sinus syndrome in the past, the specificis are not well described. .     - no recent bleeding on anticoag. No recent palpitatoins.   2. GI bleedinghistory - several old records reviewed to better understand his GI bleeding history including records from [place] and [place] - In 2010 admitted to [facility] with GI bleed. Received 2 units of pRBCs. EGD and colonoscopy unremarkable.  In 2012 patient was in [facility] with hematocheazia. He had EGD and Colonoscopy by  Dr.Oneil and Capsule endoscopy by Dr.Fein which was negative. He had a bleeding scan which was negative. Similar things happened few years ago when he went to [facility] and w/up was negative.  He bleeding restarted and he is having hematocheazia again x 2  He was originally on coumadin for afib - Pt had no further GIB while in hospital and had stable Hgb. EGD/colo were negative. Capsule study done and results pending. Dr. Curly [place] to call pt w/ results. Pt recommended to remain off coumadin given h/o GIB with no source found at this time. Ok to d/c home today. - admit 02/2012 with GI bleed while off anticoag, thought to be diverticular bleed. Received 2 units of pRBCs. Colonoscopy with large clots in cecum.   - has tolerated anticoagulation thus far without bleeding troubles.   3. History of TIA - acute episode of slurred speech, facial droop, confusion during recent admission at [facility] - now on eliquis.     4. Chronic systolic HF - admit 05/6832 with CHF to [facility], appears to be new diagnosis -09/2015 echo [facility] LVEF 20-25%, moderate AI, modertate to severe MR,  - clinic note from 2013 mentions LVEF 55-60%, moderate MR.  - EKG LBBB of unknown duration.  - cath 11/2015 with mild nonobstructive CAD, CI 2.2, mean PA 23, PCWP 11 - Jan 2018 echo [place]: LVEF 25-30%, mild RV dysfunction, severe MR, mod TR, restrictive diastolic function  04/9620 echo LVEF 40-45%  No recent SOB/DOE. No LE edema - compliant with meds - home weights 158 lbs, actually down 9  lbs since our 02/2018   5. 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   - 09/2017 echo LVEF 40-45%, severe MR. Prolapse of anterior leaflet with severe posterior MR - no recent symptoms   7. Aortic aneursym - MRI/MRA at [facility] with aortic  root aneurysm 5.1 x 4.7 cm - echo 09/2015 reported root diameter 3.8 cm. Unclear if missed dilated portion.  - CT PE [facility] 09/2015 with mildy dilated ascedning aorta at 4.2 cm.   - 09/2017 echo aortic root 4.7 cm     The patient does not have symptoms concerning for COVID-19 infection (fever, chills, cough, or new shortness of breath).    Past Medical History:  Diagnosis Date  . Atrial fibrillation, permanent   . Chronic anemia   . Chronic leukopenia   . Congestive heart failure (CHF) ([place])   . Diabetes ([place])   . GERD (gastroesophageal reflux disease)   . Hyperlipidemia   . Hypertension   . Pancytopenia [facility])    Past Surgical History:  Procedure Laterality Date  . 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 CARDIAC CATHETERIZATION N/A 12/03/2015   Procedure: Right/Left Heart Cath and Coronary Angiography;  Surgeon: Peter M Martinique, MD;  Location: [place] [facility];  Service: Cardiovascular;  Laterality: N/A;  . PERIPHERAL VASCULAR CATHETERIZATION N/A 12/03/2015   Procedure: Thoracic Aortogram;  Surgeon: Peter M Martinique, MD;  Location: [place] CV [facility];  Service: Cardiovascular;  Laterality: N/A;     Current Meds  Medication Sig  . allopurinol (ZYLOPRIM) 300 MG tablet Take 300 mg by mouth daily.   [name] apixaban (ELIQUIS) 5 MG TABS tablet Take 5 mg by mouth 2 (two) times daily.  [name] docusate sodium (COLACE) 100 MG capsule Take 100-200 mg by mouth daily as needed for mild constipation.  . furosemide (LASIX) 20 MG tablet Take 20 mg by mouth daily as needed.   [name] HYDROcodone-acetaminophen (NORCO) 7.5-325 MG tablet Take 1 tablet by mouth 3 (three) times daily as needed.  [name] ibuprofen (ADVIL,MOTRIN) 200 MG tablet Take 200 mg by mouth every 6 (six) hours as needed.  . isosorbide mononitrate (IMDUR) 30 MG 24 hr tablet Take 30 mg by mouth every morning.   . magnesium oxide (MAG-OX) 400 MG tablet Take 400 mg by mouth 2  (two) times daily.  . metoprolol succinate (TOPROL-XL) 25 MG 24 hr tablet TAKE 1 TABLET BY MOUTH TWICE DAILY  . omeprazole (PRILOSEC) 40 MG capsule Take 40 mg by mouth daily.   . potassium chloride SA (K-DUR,KLOR-CON) 20 MEQ tablet Take 20 mEq by mouth daily as needed.   . sacubitril-valsartan (ENTRESTO) 24-26 MG Take 1 tablet by mouth 2 (two) times daily.     Allergies:   Patient has no known allergies.   Social History   Tobacco Use  . Smoking status: Former Smoker    Packs/day: 0.50    Years: 46.00    Pack years: 23.00    Types: Cigarettes    Start date: 10/27/1949    Last attempt to quit: 04/27/1995    Years since quitting: 23.3  . Smokeless tobacco: Never Used  Substance Use Topics  . Alcohol use: Never    Alcohol/week: 0.0 standard drinks    Frequency: Never  . Drug  use: Never     Family Hx: The patient's Family history is unknown by patient.  ROS:   Please see the history of present illness.     All other systems reviewed and are negative.   Prior CV studies:   The following studies were reviewed today:  11/2015 LHC/RHC  Prox LAD to Mid LAD lesion, 20 %stenosed.  There is severe left ventricular systolic dysfunction.  The left ventricular ejection fraction is less than 25% by visual estimate.  There is mild (2+) mitral regurgitation.  LV end diastolic pressure is normal.  LV end diastolic pressure is normal.  1. Minor nonobstructive CAD 2. Severe LV enlargement with markedly reduced LV systolic function. 3. Moderate dilation of proximal aorta. 4. Normal right heart pressures, normal LV filling pressures.  5. Mild MR Aortic Root: The aortic root displays moderate dilation. Plan: medical management.    Jan 2018 Echo [facility] LVEF 25-30%, severe MR.    09/2017 echo Study Conclusions  - Left ventricle: The cavity size was normal. Wall thickness was increased in a pattern of mild LVH. Systolic function was mildly to moderately  reduced. The estimated ejection fraction was in the range of 40% to 45%. Diffuse hypokinesis. There is severe hypokinesis of the basalinferior myocardium. Indeterminate diastolic function. - Aortic valve: There was mild regurgitation. - Aorta: Aortic root dimension: 47 mm (ED). - Aortic root: The aortic root was moderately dilated. - Mitral valve: Mildly thickened leaflets. Mild prolapse, involving the anterior leaflet. There was severe regurgitation directed posteriorly. Effective regurgitant orifice (PISA): 0.41 cm^2. Regurgitant volume (PISA): 79 ml. - Left atrium: The atrium was severely dilated. - Right ventricle: Systolic function was mildly reduced. - Right atrium: Central venous pressure (est): 3 mm Hg. - Atrial septum: No defect or patent foramen ovale was identified. - Tricuspid valve: There was mild regurgitation. - Pulmonary arteries: PA peak pressure: 43 mm Hg (S). - Pericardium, extracardiac: There was no pericardial effusion.  Labs/Other Tests and Data Reviewed:    EKG:  No ECG reviewed.  Recent Labs: 05/19/2018: ALT 9; BUN 12; Creatinine 1.32; Hemoglobin 12.6; Platelets 119; Potassium 4.6; Sodium 137   Recent Lipid Panel No results found for: CHOL, TRIG, HDL, CHOLHDL, LDLCALC, LDLDIRECT  Wt Readings from Last 3 Encounters:  05/19/18 163 lb 12.8 oz (74.3 kg)  03/07/18 167 lb (75.8 kg)  01/20/18 163 lb 6.4 oz (74.1 kg)     Objective:    Vital Signs:  Ht 5\' 11"  (1.803 m)   BMI 22.85 kg/m    Normal affect. Normal speech pattern and tone. Comfortable, no apparent distress. No audible signs of SOB or wheezing.   ASSESSMENT & PLAN:     1. PAF - no recent symptoms, continue current meds including anticoag  2. Chronic systolic HF - prior orthostatic symptoms limit medical therapy - significant LVEF improvement by recent echo, in setting of severe MR his true LVEF is lower than what is measured by echo.  - no recent symptoms, continue current meds   3. Mitral regurgitation -poor candidate for any form of MV repair or replacement given advanced age, comorbidities, thymoma. He also is asymptomatic with improving LVEF. If in the future ran into significant symptoms could at least have a conversation about a mitraclip. In absence of symptoms with improving LVEF would not consider at this time.  - continue to monitor at this time.   4. Aortic aneurysm - continue to monitor, would be a poor candidate for any intervention if  he ever reached 5.5 cm.     F/u 4 months        COVID-19 Education: The signs and symptoms of COVID-19 were discussed with the patient and how to seek care for testing (follow up with PCP or arrange E-visit).  The importance of social distancing was discussed today.  Time:   Today, I have spent 18 minutes with the patient with telehealth technology discussing the above problems.     Medication Adjustments/Labs and Tests Ordered: Current medicines are reviewed at length with the patient today.  Concerns regarding medicines are outlined above.   Tests Ordered: No orders of the defined types were placed in this encounter.   Medication Changes: No orders of the defined types were placed in this encounter.   Disposition:  Follow up 4 months  Signed, Carlyle Dolly, MD  09/11/2018 1:33 PM    [facility]

## 2018-09-11 NOTE — Patient Instructions (Addendum)
Medication Instructions:   Your physician recommends that you continue on your current medications as directed. Please refer to the Current Medication list given to you today.  Labwork:  NONE  Testing/Procedures:  NONE  Follow-Up:  Your physician recommends that you schedule a follow-up appointment in: 4 months.  Any Other Special Instructions Will Be Listed Below (If Applicable).  If you need a refill on your cardiac medications before your next appointment, please call your pharmacy. 

## 2018-10-20 ENCOUNTER — Telehealth: Payer: Self-pay | Admitting: Cardiology

## 2018-10-20 NOTE — Telephone Encounter (Signed)
Son Marchia Bond) calling with complaints.  No weight gain, actually losing weight.  For the last 1 - 2 weeks - feeling weak, pain in stomach area, breathing hard.  No swelling noted anywhere.  Mild chest pain every now and then.  SOB.  These symptoms are new since his telehealth visit in May.  Saw pmd a couple a weeks ago.  Was feeling pretty good at that visit as well.  Weight was 166 lb today.

## 2018-10-20 NOTE — Telephone Encounter (Signed)
Unclear whats going on, I would suggest he be evaluated in the ER to sort it out. If weights are going down would argue against it being fluid related, unclear without further workup what may be going on. Best to find out and treat it early before it progresses   Zandra Abts MD

## 2018-10-20 NOTE — Telephone Encounter (Signed)
Son Marchia Bond) notified & verbalized understanding.

## 2018-10-20 NOTE — Telephone Encounter (Signed)
Per son patient has been feeling weak and having trouble breathing

## 2018-11-16 ENCOUNTER — Telehealth: Payer: Self-pay | Admitting: *Deleted

## 2018-11-16 NOTE — Telephone Encounter (Signed)
Contacted by son Marchia Bond to verify father's appt times on Monday 7/27. Reviewed current [facility] visitor restrictions with him. He states patient is hard of hearing. Informed him that he can be called during appt. He gave this number to add to patient's appt [phone]

## 2018-11-17 ENCOUNTER — Telehealth: Payer: Self-pay | Admitting: *Deleted

## 2018-11-17 ENCOUNTER — Telehealth: Payer: Self-pay | Admitting: Hematology

## 2018-11-17 NOTE — Telephone Encounter (Signed)
Scheduled appt per 7/24 sch message - unable to reach pt son . Left message with appt date and time

## 2018-11-17 NOTE — Telephone Encounter (Signed)
Son Marchia Bond called office - left voice mail asking to cancel father's appointments on Monday as patient has another MD appt at same time. They would like to reschedule it. Appt for 7/27 cancelled. Message sent to scheduling.

## 2018-11-20 ENCOUNTER — Ambulatory Visit: Payer: Medicare FFS | Admitting: Hematology

## 2018-11-20 ENCOUNTER — Other Ambulatory Visit: Payer: Medicare FFS

## 2018-12-08 ENCOUNTER — Telehealth: Payer: Self-pay | Admitting: Hematology

## 2018-12-08 NOTE — Telephone Encounter (Signed)
Returned call to son Marchia Bond re rescheduling 8/18. Confirmed 9/2 appointment with son.

## 2018-12-12 ENCOUNTER — Other Ambulatory Visit: Payer: Medicare PPO

## 2018-12-12 ENCOUNTER — Ambulatory Visit: Payer: Self-pay | Admitting: Hematology

## 2018-12-26 ENCOUNTER — Telehealth: Payer: Self-pay | Admitting: Hematology

## 2018-12-26 NOTE — Telephone Encounter (Signed)
Spoke with son per 9/1 sch message- he wanted to reschedule but doesn't know when . Cancelled appt and son says he will call back to reschedule appt

## 2018-12-27 ENCOUNTER — Inpatient Hospital Stay: Payer: Medicare PPO | Admitting: Hematology

## 2018-12-27 ENCOUNTER — Inpatient Hospital Stay: Payer: Medicare PPO

## 2019-01-17 ENCOUNTER — Encounter: Payer: Self-pay | Admitting: Cardiology

## 2019-01-17 ENCOUNTER — Other Ambulatory Visit: Payer: Self-pay

## 2019-01-17 ENCOUNTER — Ambulatory Visit (INDEPENDENT_AMBULATORY_CARE_PROVIDER_SITE_OTHER): Payer: Medicare PPO | Admitting: Cardiology

## 2019-01-17 VITALS — BP 143/89 | HR 76 | Ht 71.0 in | Wt 159.4 lb

## 2019-01-17 DIAGNOSIS — I712 Thoracic aortic aneurysm, without rupture, unspecified: Secondary | ICD-10-CM

## 2019-01-17 DIAGNOSIS — Z23 Encounter for immunization: Secondary | ICD-10-CM | POA: Diagnosis not present

## 2019-01-17 DIAGNOSIS — I480 Paroxysmal atrial fibrillation: Secondary | ICD-10-CM

## 2019-01-17 DIAGNOSIS — I5022 Chronic systolic (congestive) heart failure: Secondary | ICD-10-CM

## 2019-01-17 DIAGNOSIS — I340 Nonrheumatic mitral (valve) insufficiency: Secondary | ICD-10-CM | POA: Diagnosis not present

## 2019-01-17 NOTE — Patient Instructions (Signed)
Your physician recommends that you schedule a follow-up appointment in: 4 MONTHS WITH DR BRANCH  Your physician recommends that you continue on your current medications as directed. Please refer to the Current Medication list given to you today.  Your physician has requested that you have an echocardiogram. Echocardiography is a painless test that uses sound waves to create images of your heart. It provides your doctor with information about the size and shape of your heart and how well your heart's chambers and valves are working. This procedure takes approximately one hour. There are no restrictions for this procedure.  Thank you for choosing [facility]!!    

## 2019-01-17 NOTE — Progress Notes (Signed)
Clinical Summary Mr. Lacerda is a 85 y.o.male seen today for follow up of the following medical problems.   1. Afib - long history of afib according to notes - recent afib with RVR during 09/2015 admission at [facility] - previous hematemsis when on anticoagulant in the remote past. - mention of sick sinus syndrome in the past, the specificis are not well described. .     - no recent palpitations - no bleeding on eliquis - compliant with meds  2. GI bleedinghistory - several old records reviewed to better understand his GI bleeding history including records from [place] and [place] - In 2010 admitted to [facility] with GI bleed. Received 2 units of pRBCs. EGD and colonoscopy unremarkable.  In 2012 patient was in [facility] with hematocheazia. He had EGD and Colonoscopy by Dr.Oneil and Capsule endoscopy by Dr.Fein which was negative. He had a bleeding scan which was negative. Similar things happened few years ago when he went to [facility] and w/up was negative.  He bleeding restarted and he is having hematocheazia again x 2  He was originally on coumadin for afib - Pt had no further GIB while in hospital and had stable Hgb. EGD/colo were negative. Capsule study done and results pending. Dr. Curly Shores to call pt w/ results. Pt recommended to remain off coumadin given h/o GIB with no source found at this time. Ok to d/c home today. - admit 02/2012 with GI bleed while off anticoag, thought to be diverticular bleed. Received 2 units of pRBCs. Colonoscopy with large clots in cecum.   - has tolerated anticoagulation thus far without bleeding troubles.   3. History of TIA - acute episode of slurred speech, facial droop, confusion during recent admission at [facility] - now on eliquis.     4. Chronic systolic HF - admit [id number] with CHF to [facility], appears to be new diagnosis -09/2015 echo [facility] LVEF 20-25%, moderate AI, modertate to severe MR,  - clinic  note from 2013 mentions LVEF 55-60%, moderate MR.  - EKG LBBB of unknown duration.  - cath 11/2015 with mild nonobstructive CAD, CI 2.2, mean PA 23, PCWP 11 - Jan 2018 echo [place]: LVEF 25-30%, mild RV dysfunction, severe MR, mod TR, restrictive diastolic function  [id number] echo LVEF 40-45%   - takes lasix prn, takes rarely - no SOB or DOE - compliant with meds - exertion is limited by leg weakness.  - some orthostatic symptoms at times.   5. 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   -09/2017 echo LVEF 40-45%, severe MR. Prolapse of anterior leaflet with severe posterior MR - no symptoms   7. 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 Unclear if missed dilated portion.  - CT PE [facility] 09/2015 with mildy dilated ascedning aorta at 4.2 cm.  - 09/2017 echo aortic root 4.7 cm     Past Medical History:  Diagnosis Date  . Atrial fibrillation, permanent   . Chronic anemia   . Chronic leukopenia   . Congestive heart failure (CHF) ([place])   . Diabetes ([place])   . GERD (gastroesophageal reflux disease)   . Hyperlipidemia   . Hypertension   . Pancytopenia ([place])      No Known  Allergies   Current Outpatient Medications  Medication Sig Dispense Refill  . allopurinol (ZYLOPRIM) 300 MG tablet Take 300 mg by mouth daily.     [name] apixaban (ELIQUIS) 5 MG TABS tablet Take 5 mg by mouth 2 (two) times daily.    [name] docusate sodium (COLACE) 100 MG capsule Take 100-200 mg by mouth daily as needed for mild constipation.    . folic acid (FOLVITE) 1 MG tablet Take 1 mg by mouth daily.     . furosemide (LASIX) 20 MG tablet Take 20 mg by mouth daily as needed.     [name] HYDROcodone-acetaminophen (NORCO) 7.5-325 MG tablet Take 1  tablet by mouth 3 (three) times daily as needed.    [name] ibuprofen (ADVIL,MOTRIN) 200 MG tablet Take 200 mg by mouth every 6 (six) hours as needed.    . isosorbide mononitrate (IMDUR) 30 MG 24 hr tablet Take 30 mg by mouth every morning.     . magnesium oxide (MAG-OX) 400 MG tablet Take 400 mg by mouth 2 (two) times daily.    . metoprolol succinate (TOPROL-XL) 25 MG 24 hr tablet TAKE 1 TABLET BY MOUTH TWICE DAILY 180 tablet 2  . omeprazole (PRILOSEC) 40 MG capsule Take 40 mg by mouth daily.     . potassium chloride SA (K-DUR,KLOR-CON) 20 MEQ tablet Take 20 mEq by mouth daily as needed.     . sacubitril-valsartan (ENTRESTO) 24-26 MG Take 1 tablet by mouth 2 (two) times daily. 28 tablet 0   No current facility-administered medications for this visit.      Past Surgical History:  Procedure Laterality Date  . 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 CARDIAC CATHETERIZATION N/A 12/03/2015   Procedure: Right/Left Heart Cath and Coronary Angiography;  Surgeon: Peter M Martinique, MD;  Location: [place] [facility];  Service: Cardiovascular;  Laterality: N/A;  . PERIPHERAL VASCULAR CATHETERIZATION N/A 12/03/2015   Procedure: Thoracic Aortogram;  Surgeon: Peter M Martinique, MD;  Location: [place] [facility];  Service: Cardiovascular;  Laterality: N/A;     No Known Allergies    Family History  Family history unknown: Yes     Social History Mr. Derenzo reports that he quit smoking about 23 years ago. His smoking use included cigarettes. He started smoking about 69 years ago. He has a 23.00 pack-year smoking history. He has never used smokeless tobacco. Mr. Reavis reports no history of alcohol use.   Review of Systems CONSTITUTIONAL: No weight loss, fever, chills, weakness or fatigue.  HEENT: Eyes: No visual loss, blurred vision, double vision or yellow sclerae.No hearing loss, sneezing, congestion, runny nose or sore throat.   SKIN: No rash or itching.  CARDIOVASCULAR: per hpi RESPIRATORY: No shortness of breath, cough or sputum.  GASTROINTESTINAL: No anorexia, nausea, vomiting or diarrhea. No abdominal pain or blood.  GENITOURINARY: No burning on urination, no polyuria NEUROLOGICAL: No headache, dizziness, syncope, paralysis, ataxia, numbness or tingling in the extremities. No change in bowel or bladder control.  MUSCULOSKELETAL: No muscle, back pain, joint pain or stiffness.  LYMPHATICS: No enlarged nodes. No history of splenectomy.  PSYCHIATRIC: No history of depression or anxiety.  ENDOCRINOLOGIC: No reports of sweating, cold or heat intolerance. No polyuria or polydipsia.  [name]   Physical Examination Today's Vitals   01/17/19 1257  BP: (!) 143/89  Pulse: 76  SpO2: 98%  Weight: 159 lb 6.4 oz (72.3 kg)  Height: 5\' 11"  (1.803 m)  Body mass index is 22.23 kg/m.  Gen: resting comfortably, no acute distress HEENT: no scleral icterus, pupils equal round and reactive, no palptable cervical adenopathy,  CV: irreg, 3/6 systolic murmur apex Resp: Clear to auscultation bilaterally GI: abdomen is soft, non-tender, non-distended, normal bowel sounds, no hepatosplenomegaly MSK: extremities are warm, no edema.  Skin: warm, no rash Neuro:  no focal deficits Psych: appropriate affect   Diagnostic Studies 11/2015 LHC/RHC  Prox LAD to Mid LAD lesion, 20 %stenosed.  There is severe left ventricular systolic dysfunction.  The left ventricular ejection fraction is less than 25% by visual estimate.  There is mild (2+) mitral regurgitation.  LV end diastolic pressure is normal.  LV end diastolic pressure is normal.  1. Minor nonobstructive CAD 2. Severe LV enlargement with markedly reduced LV systolic function. 3. Moderate dilation of proximal aorta. 4. Normal right heart pressures, normal LV filling pressures.  5. Mild MR Aortic Root: The aortic root displays moderate dilation. Plan: medical  management.    Jan 2018 Echo [facility] LVEF 25-30%, severe MR.    09/2017 echo Study Conclusions  - Left ventricle: The cavity size was normal. Wall thickness was increased in a pattern of mild LVH. Systolic function was mildly to moderately reduced. The estimated ejection fraction was in the range of 40% to 45%. Diffuse hypokinesis. There is severe hypokinesis of the basalinferior myocardium. Indeterminate diastolic function. - Aortic valve: There was mild regurgitation. - Aorta: Aortic root dimension: 47 mm (ED). - Aortic root: The aortic root was moderately dilated. - Mitral valve: Mildly thickened leaflets. Mild prolapse, involving the anterior leaflet. There was severe regurgitation directed posteriorly. Effective regurgitant orifice (PISA): 0.41 cm^2. Regurgitant volume (PISA): 79 ml. - Left atrium: The atrium was severely dilated. - Right ventricle: Systolic function was mildly reduced. - Right atrium: Central venous pressure (est): 3 mm Hg. - Atrial septum: No defect or patent foramen ovale was identified. - Tricuspid valve: There was mild regurgitation. - Pulmonary arteries: PA peak pressure: 43 mm Hg (S). - Pericardium, extracardiac: There was no pericardial effusion.    Assessment and Plan   1. PAF - no symptoms, continue current meds  2. Chronic systolic HF -orthostatic symptoms limit medical therapy -significant LVEF improvement by recent echo, in setting of severe MR his true LVEF is lower than what is measured by echo.  -no significant symptoms - continue current meds  3. Mitral regurgitation -repeat echo - poor candidate for MVR but if clinically indicated could consider mitraclip  4. Aortic aneurysm - f/u echo results     F/u 4 months   Arnoldo Lenis, M.D.,

## 2019-02-13 ENCOUNTER — Telehealth: Payer: Self-pay | Admitting: Cardiology

## 2019-02-13 NOTE — Telephone Encounter (Signed)
°  Precert needed for: echo   Location: [facility]    Date: Mar 15, 2019

## 2019-02-14 ENCOUNTER — Other Ambulatory Visit: Payer: Medicare PPO

## 2019-02-15 ENCOUNTER — Other Ambulatory Visit: Payer: Medicare PPO

## 2019-03-15 ENCOUNTER — Other Ambulatory Visit: Payer: Medicare PPO

## 2019-05-21 ENCOUNTER — Ambulatory Visit: Payer: Medicare PPO | Admitting: Cardiology

## 2019-05-25 ENCOUNTER — Ambulatory Visit: Payer: Medicare PPO | Admitting: Cardiology

## 2019-06-11 ENCOUNTER — Ambulatory Visit: Payer: Medicare FFS | Admitting: Cardiology

## 2019-06-13 ENCOUNTER — Ambulatory Visit (INDEPENDENT_AMBULATORY_CARE_PROVIDER_SITE_OTHER): Payer: Medicare FFS

## 2019-06-13 ENCOUNTER — Other Ambulatory Visit: Payer: Self-pay

## 2019-06-13 DIAGNOSIS — I340 Nonrheumatic mitral (valve) insufficiency: Secondary | ICD-10-CM

## 2019-06-18 ENCOUNTER — Ambulatory Visit (INDEPENDENT_AMBULATORY_CARE_PROVIDER_SITE_OTHER): Payer: Medicare FFS | Admitting: Cardiology

## 2019-06-18 ENCOUNTER — Encounter: Payer: Self-pay | Admitting: Cardiology

## 2019-06-18 ENCOUNTER — Other Ambulatory Visit: Payer: Self-pay

## 2019-06-18 VITALS — BP 124/74 | HR 88 | Ht 71.0 in | Wt 159.0 lb

## 2019-06-18 DIAGNOSIS — I340 Nonrheumatic mitral (valve) insufficiency: Secondary | ICD-10-CM

## 2019-06-18 DIAGNOSIS — I5022 Chronic systolic (congestive) heart failure: Secondary | ICD-10-CM | POA: Diagnosis not present

## 2019-06-18 DIAGNOSIS — I480 Paroxysmal atrial fibrillation: Secondary | ICD-10-CM | POA: Diagnosis not present

## 2019-06-18 DIAGNOSIS — I712 Thoracic aortic aneurysm, without rupture, unspecified: Secondary | ICD-10-CM

## 2019-06-18 NOTE — Progress Notes (Signed)
Clinical Summary Thomas Costa is a 86 y.o.male seen today for follow up of the following medical problems.   1. Afib - long history of afib according to notes - recent afib with RVR during 09/2015 admission at [facility] - previous hematemsis when on anticoagulant in the remote past. - mention of sick sinus syndrome in the past, the specificis are not well described. .     - denies any palpitations - compliant with meds. No bleeding eliquis.   2. GI bleedinghistory - several old records reviewed to better understand his GI bleeding history including records from [place] and [place] - In 2010 admitted to [facility] with GI bleed. Received 2 units of pRBCs. EGD and colonoscopy unremarkable.  In 2012 patient was in [facility] with hematocheazia. He had EGD and Colonoscopy by Dr.Oneil and Capsule endoscopy by Dr.Fein which was negative. He had a bleeding scan which was negative. Similar things happened few years ago when he went to [facility] and w/up was negative.  He bleeding restarted and he is having hematocheazia again x 2  He was originally on coumadin for afib - Pt had no further GIB while in hospital and had stable Hgb. EGD/colo were negative. Capsule study done and results pending. Dr. Curly Shores to call pt w/ results. Pt recommended to remain off coumadin given h/o GIB with no source found at this time. Ok to d/c home today. - admit 02/2012 with GI bleed while off anticoag, thought to be diverticular bleed. Received 2 units of pRBCs. Colonoscopy with large clots in cecum.   - has tolerated anticoagulation thus far without bleeding troubles.   3. History of TIA - acute episode of slurred speech, facial droop, confusion during recent admission at [facility] - now on eliquis.     4. Chronic systolic HF - admit [id number] with CHF to [facility], appears to be new diagnosis -09/2015 echo [facility] LVEF 20-25%, moderate AI, modertate to severe MR,  - clinic  note from 2013 mentions LVEF 55-60%, moderate MR.  - EKG LBBB of unknown duration.  - cath 11/2015 with mild nonobstructive CAD, CI 2.2, mean PA 23, PCWP 11 - Jan 2018 echo [facility]: LVEF 25-30%, mild RV dysfunction, severe MR, mod TR, restrictive diastolic function  [id number] echo LVEF 40-45%   05/2019 echo LVEF 50-55%, severe MR.  - some recent SOB/DOE over the last month - no recent edema - no orthopnea.     5. 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 Negative for malignancy      6Mitral regurgitation - long history of MR followed by previous cardiologist - by echo 09/2015 reported moderate to severe - echo Jan 2018 [facility] severe MR.   -09/2017 echo LVEF 40-45%, severe MR. Prolapse of anterior leaflet with severe posterior MR  05/2019 echo LVEF 50-55%, severe MR, LVIDs 3.8  7. 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 Unclear if missed dilated portion.  - CT PE [facility] 09/2015 with mildy dilated ascedning aorta at 4.2 cm.  - 09/2017 echo aortic root 4.7 cm Jan 2020 [facility] aortic root 5cm, ascneding aorta 3.8 cm.  - 05/2019 aortic root 4.7 cm.     8. Chest pain - infrequent chest pain - episode on Saturday while sitting in chair.  - starts in lower abdomen and radiates through abdomen into  chest Past Medical History:  Diagnosis Date  . Atrial fibrillation, permanent   . Chronic anemia   . Chronic leukopenia   . Congestive heart failure (CHF) ([place])   . Diabetes ([place])   . GERD (gastroesophageal reflux disease)   . Hyperlipidemia   . Hypertension   . Pancytopenia ([place])      No Known Allergies   Current Outpatient Medications  Medication Sig Dispense Refill  . allopurinol (ZYLOPRIM) 300 MG tablet Take 300 mg by mouth daily.     [name] apixaban (ELIQUIS) 5 MG TABS tablet Take 5 mg  by mouth 2 (two) times daily.    [name] docusate sodium (COLACE) 100 MG capsule Take 100-200 mg by mouth daily as needed for mild constipation.    . folic acid (FOLVITE) 1 MG tablet Take 1 mg by mouth daily.     . furosemide (LASIX) 20 MG tablet Take 20 mg by mouth daily as needed.     [name] HYDROcodone-acetaminophen (NORCO) 7.5-325 MG tablet Take 1 tablet by mouth 3 (three) times daily as needed.    . isosorbide mononitrate (IMDUR) 30 MG 24 hr tablet Take 15 mg by mouth every morning.     . magnesium oxide (MAG-OX) 400 MG tablet Take 400 mg by mouth 2 (two) times daily.    . metoprolol succinate (TOPROL-XL) 25 MG 24 hr tablet TAKE 1 TABLET BY MOUTH TWICE DAILY 180 tablet 2  . omeprazole (PRILOSEC) 40 MG capsule Take 40 mg by mouth daily.     . potassium chloride SA (K-DUR,KLOR-CON) 20 MEQ tablet Take 20 mEq by mouth daily as needed.     . sacubitril-valsartan (ENTRESTO) 24-26 MG Take 1 tablet by mouth 2 (two) times daily. 28 tablet 0   No current facility-administered medications for this visit.     Past Surgical History:  Procedure Laterality Date  . 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 CARDIAC CATHETERIZATION N/A 12/03/2015   Procedure: Right/Left Heart Cath and Coronary Angiography;  Surgeon: Peter M Martinique, MD;  Location: [place] [facility];  Service: Cardiovascular;  Laterality: N/A;  . PERIPHERAL VASCULAR CATHETERIZATION N/A 12/03/2015   Procedure: Thoracic Aortogram;  Surgeon: Peter M Martinique, MD;  Location: [place] [facility];  Service: Cardiovascular;  Laterality: N/A;     No Known Allergies    Family History  Family history unknown: Yes     Social History Mr. Migneault reports that he quit smoking about 24 years ago. His smoking use included cigarettes. He started smoking about 69 years ago. He has a 23.00 pack-year smoking history. He has never used smokeless tobacco. Mr. Weisheit reports no history of  alcohol use.   Review of Systems CONSTITUTIONAL: No weight loss, fever, chills, weakness or fatigue.  HEENT: Eyes: No visual loss, blurred vision, double vision or yellow sclerae.No hearing loss, sneezing, congestion, runny nose or sore throat.  SKIN: No rash or itching.  CARDIOVASCULAR: per hpi RESPIRATORY: No shortness of breath, cough or sputum.  GASTROINTESTINAL: No anorexia, nausea, vomiting or diarrhea. No abdominal pain or blood.  GENITOURINARY: No burning on urination, no polyuria NEUROLOGICAL: No headache, dizziness, syncope, paralysis, ataxia, numbness or tingling in the extremities. No change in bowel or bladder control.  MUSCULOSKELETAL: No muscle, back pain, joint pain or stiffness.  LYMPHATICS: No enlarged nodes. No history of splenectomy.  PSYCHIATRIC: No history of depression or anxiety.  ENDOCRINOLOGIC: No reports of sweating, cold or heat intolerance.  No polyuria or polydipsia.  [name]   Physical Examination Today's Vitals   06/18/19 1309  BP: 124/74  Pulse: 88  SpO2: 94%  Weight: 159 lb (72.1 kg)  Height: 5\' 11"  (1.803 m)   Body mass index is 22.18 kg/m.  Gen: resting comfortably, no acute distress HEENT: no scleral icterus, pupils equal round and reactive, no palptable cervical adenopathy,  CV: irreg, 2/6 systolic murmur apex, no jvd Resp: Clear to auscultation bilaterally GI: abdomen is soft, non-tender, non-distended, normal bowel sounds, no hepatosplenomegaly MSK: extremities are warm, no edema.  Skin: warm, no rash Neuro:  no focal deficits Psych: appropriate affect   Diagnostic Studies  11/2015 LHC/RHC  Prox LAD to Mid LAD lesion, 20 %stenosed.  There is severe left ventricular systolic dysfunction.  The left ventricular ejection fraction is less than 25% by visual estimate.  There is mild (2+) mitral regurgitation.  LV end diastolic pressure is normal.  LV end diastolic pressure is normal.  1. Minor nonobstructive CAD 2. Severe LV  enlargement with markedly reduced LV systolic function. 3. Moderate dilation of proximal aorta. 4. Normal right heart pressures, normal LV filling pressures.  5. Mild MR Aortic Root: The aortic root displays moderate dilation. Plan: medical management.    Jan 2018 Echo [facility] LVEF 25-30%, severe MR.    09/2017 echo Study Conclusions  - Left ventricle: The cavity size was normal. Wall thickness was increased in a pattern of mild LVH. Systolic function was mildly to moderately reduced. The estimated ejection fraction was in the range of 40% to 45%. Diffuse hypokinesis. There is severe hypokinesis of the basalinferior myocardium. Indeterminate diastolic function. - Aortic valve: There was mild regurgitation. - Aorta: Aortic root dimension: 47 mm (ED). - Aortic root: The aortic root was moderately dilated. - Mitral valve: Mildly thickened leaflets. Mild prolapse, involving the anterior leaflet. There was severe regurgitation directed posteriorly. Effective regurgitant orifice (PISA): 0.41 cm^2. Regurgitant volume (PISA): 79 ml. - Left atrium: The atrium was severely dilated. - Right ventricle: Systolic function was mildly reduced. - Right atrium: Central venous pressure (est): 3 mm Hg. - Atrial septum: No defect or patent foramen ovale was identified. - Tricuspid valve: There was mild regurgitation. - Pulmonary arteries: PA peak pressure: 43 mm Hg (S). - Pericardium, extracardiac: There was no pericardial effusion.   Assessment and Plan  1. PAF - no reent symptoms, continue current meds - EKG today shows rate controlled afib  2. Chronic systolic HF -orthostatic symptoms limit medical therapy -LVEF has improved and back in normal range by recent echo, though with severe MR true LVEF would be somewhat lower - continue medical therapy  3. Mitral regurgitation - poor candidate for MVR. His LVEF has actually improved over time, no significant  symptoms.  - continue to monitor  4. Aortic aneurysm - mild by recent imaging, continue to monitor.    F/u 6 months  Arnoldo Lenis, M.D.

## 2019-06-18 NOTE — Patient Instructions (Addendum)
Medication Instructions:    Your physician recommends that you continue on your current medications as directed. Please refer to the Current Medication list given to you today.  Labwork:  NONE  Testing/Procedures:  NONE  Follow-Up:  Your physician recommends that you schedule a follow-up appointment in: 6 months (office or virtual). You will receive a reminder letter in the mail in about 4 months reminding you to call and schedule your appointment. If you don't receive this letter, please contact our office.  Any Other Special Instructions Will Be Listed Below (If Applicable).  Please call [place] [facility] to get information about your covid vaccine [phone]  Sign up online with [place] by using this link  [email or web address]  If you need a refill on your cardiac medications before your next appointment, please call your pharmacy.

## 2019-06-21 ENCOUNTER — Telehealth: Payer: Self-pay | Admitting: Cardiology

## 2019-06-21 NOTE — Telephone Encounter (Signed)
Pt received call back from wait list from [facility] wait list and was given access code and didn't know what number to call back - aware that we wouldn't have access to any specific site that called him and he had saved number that had called and would call that number back for info

## 2019-06-21 NOTE — Telephone Encounter (Signed)
Question about the COVID vaccine that Thomas Costa helped them with setting up. Stated the number is incorrect

## 2019-07-09 ENCOUNTER — Other Ambulatory Visit: Payer: Self-pay | Admitting: Cardiology

## 2019-09-12 NOTE — Progress Notes (Signed)
Cardiology Office Note  Date: 09/13/2019   ID: Thomas Costa., DOB 12-26-1932, MRN [id number]  PCP:  Lonia Mad, MD  Cardiologist:  Carlyle Dolly, MD Electrophysiologist:  None   Chief Complaint: F/U PAF, Chronic systolic CHF, MR, Aortic aneurysm, CP,  History of Present Illness: Thomas Costa. is a 86 y.o. male with a history of  PAF, Chronic systolic CHF, MR, Aortic aneurysm, CP, mediastinal mass (thymoma seeing oncology/benign), history of GI bleed, TIA   He had a recent ED visit to [facility] with complaints of weakness after being transported from the [facility] center.  He complained of irregular low heartbeat.  He was in preop for cataract surgery when he was found to be in rate controlled A. fib with left bundle branch block and was sent to the emergency department.  Lab work revealed mild pancytopenia.  Patient stated he felt improved.  There was no acute pathology on imaging, no UTI.  Patient had stable blood pressures and controlled heart rate.  Unclear cause of weakness.  Recommended follow-up with patient's cardiologist.  Lab work showed a sodium of 134, potassium 4.8, creatinine 1.4, GFR 53, hemoglobin 11.4, hematocrit 35.4, platelets 110.  CT for flank pain and CVA tenderness showed multiple renal cysts.  Dependent atelectasis, global cardiomyopathy, some enlarged mesenteric lymph nodes.  Last encounter 06/18/2019 with Dr. Harl Bowie.  His A. fib was rate controlled.  He was having no symptoms.  His LVEF had improved and was back in normal range via recent echo though he still had severe MR.  He would be a poor candidate for mitral valve replacement.  His LVEF had actually improved over time and no significant symptoms.  Aortic aneurysm was mild by recent imaging  Patient son who is with him states they were referred here after the emergency room visit when he became weak during preop for cataract/glaucoma surgery.  He states that they just want to make  sure patient is not a high risk for surgery.  Patient denies any other symptoms of weakness.  He does have atrial fibrillation but the rate is controlled at 77 today with left bundle branch noted on EKG.  His blood pressure is 108/68 today with a heart rate of 66.  Patient denies any CVA or TIA-like symptoms, orthostatic symptoms, PND or orthopnea, lower extremity edema.  States he does have occasional dizziness when standing which is transient.  He has continuing visual problems secondary to cataracts and glaucoma.   Past Medical History:  Diagnosis Date  . Atrial fibrillation, permanent ([place])   . Chronic anemia   . Chronic leukopenia   . Congestive heart failure (CHF) ([place])   . Diabetes ([place])   . GERD (gastroesophageal reflux disease)   . Hyperlipidemia   . Hypertension   . Pancytopenia [facility])     Past Surgical History:  Procedure Laterality Date  . 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 CARDIAC CATHETERIZATION N/A 12/03/2015   Procedure: Right/Left Heart Cath and Coronary Angiography;  Surgeon: Peter M Martinique, MD;  Location: [place] CV LAB;  Service: Cardiovascular;  Laterality: N/A;  . PERIPHERAL VASCULAR CATHETERIZATION N/A 12/03/2015   Procedure: Thoracic Aortogram;  Surgeon: Peter M Martinique, MD;  Location: [place] [facility] LAB;  Service: Cardiovascular;  Laterality: N/A;    Current Outpatient Medications  Medication Sig Dispense Refill  . allopurinol (ZYLOPRIM) 300 MG tablet Take 300 mg by mouth  daily.     . apixaban (ELIQUIS) 5 MG TABS tablet Take 5 mg by mouth 2 (two) times daily.    [name] docusate sodium (COLACE) 100 MG capsule Take 100-200 mg by mouth daily as needed for mild constipation.    . folic acid (FOLVITE) 1 MG tablet Take 1 mg by mouth daily.     . furosemide (LASIX) 20 MG tablet Take 20 mg by mouth daily as needed.     [name] HYDROcodone-acetaminophen (NORCO) 7.5-325 MG tablet Take 1 tablet by mouth  3 (three) times daily as needed.    . isosorbide mononitrate (IMDUR) 30 MG 24 hr tablet Take 30 mg by mouth every morning.     . magnesium oxide (MAG-OX) 400 MG tablet Take 400 mg by mouth 2 (two) times daily.    . metoprolol succinate (TOPROL-XL) 25 MG 24 hr tablet TAKE 1 TABLET TWICE DAILY 180 tablet 2  . omeprazole (PRILOSEC) 40 MG capsule Take 40 mg by mouth daily.     . potassium chloride SA (K-DUR,KLOR-CON) 20 MEQ tablet Take 40 mEq by mouth 2 (two) times daily.     . sacubitril-valsartan (ENTRESTO) 24-26 MG Take 1 tablet by mouth 2 (two) times daily. 28 tablet 0   No current facility-administered medications for this visit.   Allergies:  Patient has no known allergies.   Social History: The patient  reports that he quit smoking about 24 years ago. His smoking use included cigarettes. He started smoking about 69 years ago. He has a 23.00 pack-year smoking history. He has never used smokeless tobacco. He reports that he does not drink alcohol or use drugs.   Family History: The patient's Family history is unknown by patient.   ROS:  Please see the history of present illness. Otherwise, complete review of systems is positive for none.  All other systems are reviewed and negative.   Physical Exam: VS:  BP 108/68   Pulse 66   Ht 5\' 11"  (1.803 m)   Wt 149 lb (67.6 kg)   SpO2 90%   BMI 20.78 kg/m , BMI Body mass index is 20.78 kg/m.  Wt Readings from Last 3 Encounters:  09/13/19 149 lb (67.6 kg)  06/18/19 159 lb (72.1 kg)  01/17/19 159 lb 6.4 oz (72.3 kg)    General: Patient appears comfortable at rest. Neck: Supple, no elevated JVP or carotid bruits, no thyromegaly. Lungs: Clear to auscultation, nonlabored breathing at rest. Cardiac: Irregularly irregular rate and rhythm, no S3 or significant systolic murmur, no pericardial rub. Abdomen: Soft, nontender, no hepatomegaly, bowel sounds present, no guarding or rebound. Extremities: No pitting edema, distal pulses 2+. Skin: Warm  and dry. Musculoskeletal: No kyphosis. Neuropsychiatric: Alert and oriented x3, affect grossly appropriate.  ECG:  An ECG dated 09/13/2019  was personally reviewed today and demonstrated:  Atrial fibrillation with a rate of 77, left bundle branch block.  Recent Labwork: No results found for requested labs within last 8760 hours.  No results found for: CHOL, TRIG, HDL, CHOLHDL, VLDL, LDLCALC, LDLDIRECT  Other Studies Reviewed Today:  CT abdomen pelvis with IV contrast 09/08/2019 for flank pain, CVA tenderness and weakness, unspecified abdominal pain Impression: 1. Multiple renal cysts, some with septations and one of indeterminate density, which are incompletely characterized given the protocol. A dedicated renal CT without and with intravenous contrast is recommended for further characterization.. 2. Nodular focus along the ascending colon near the hepatic flexure, likely related to history of metastatic carcinoid. 3. Enlarged mesenteric lymph nodes,  previously described as hypermetabolic on PET.  Diagnostic Studies  11/2015 LHC/RHC Prox LAD to Mid LAD lesion, 20 %stenosed. There is severe left ventricular systolic dysfunction. The left ventricular ejection fraction is less than 25% by visual estimate. There is mild (2+) mitral regurgitation. LV end diastolic pressure is normal. LV end diastolic pressure is normal.  1. Minor nonobstructive CAD 2. Severe LV enlargement with markedly reduced LV systolic function. 3. Moderate dilation of proximal aorta. 4. Normal right heart pressures, normal LV filling pressures.  5. Mild MR Aortic Root: The aortic root displays moderate dilation. Plan: medical management.    Jan 2018 Echo [facility] LVEF 25-30%, severe MR.    09/2017 echo Study Conclusions  - Left ventricle: The cavity size was normal. Wall thickness was increased in a pattern of mild LVH. Systolic function was mildly to moderately reduced. The estimated  ejection fraction was in the range of 40% to 45%. Diffuse hypokinesis. There is severe hypokinesis of the basalinferior myocardium. Indeterminate diastolic function. - Aortic valve: There was mild regurgitation. - Aorta: Aortic root dimension: 47 mm (ED). - Aortic root: The aortic root was moderately dilated. - Mitral valve: Mildly thickened leaflets. Mild prolapse, involving the anterior leaflet. There was severe regurgitation directed posteriorly. Effective regurgitant orifice (PISA): 0.41 cm^2. Regurgitant volume (PISA): 79 ml. - Left atrium: The atrium was severely dilated. - Right ventricle: Systolic function was mildly reduced. - Right atrium: Central venous pressure (est): 3 mm Hg. - Atrial septum: No defect or patent foramen ovale was identified. - Tricuspid valve: There was mild regurgitation. - Pulmonary arteries: PA peak pressure: 43 mm Hg (S). - Pericardium, extracardiac: There was no pericardial effusion.   Assessment and Plan:  1. PAF (paroxysmal atrial fibrillation) ([place])   2. Chronic systolic heart failure ([place])   3. Mitral valve insufficiency, unspecified etiology   4. Thoracic aortic aneurysm without rupture (HCC)     Preoperative clearance evaluation Revised cardiac risk score for surgery equals 1 equaling a 0.9% perioperative risk of major cardiac event.  Has pending cataract and glaucoma surgery at [facility].   1. PAF (paroxysmal atrial fibrillation) (HCC) Rate is controlled on current medications EKG today showed atrial fibrillation with a rate of 77, left bundle branch block.  Continue metoprolol succinate 25 mg daily, Eliquis 5 mg p.o. twice daily.   2. Chronic systolic heart failure ([place]) Last echo in 2019 showed EF of 40 to 45%, diffuse hypokinesis severe in the basal inferior myocardium indeterminate diastolic dysfunction, mild aortic regurgitation, severe mitral regurgitation mild TR.  Patient denies any shortness of breath, weight  gain, or lower extremity edema.  Continue Lasix 20 mg by mouth as needed.  3. Mitral valve insufficiency, unspecified etiology Severe regurgitation on last echo 2019.  We will monitor with serial echocardiograms.  4. Thoracic aortic aneurysm without rupture (HCC) Aortic root dimension 47 mm on echo 10/06/2017   Medication Adjustments/Labs and Tests Ordered: Current medicines are reviewed at length with the patient today.  Concerns regarding medicines are outlined above.   Disposition: Follow-up with 6 months Dr. Harl Bowie or APP Signed, Levell July, NP 09/13/2019 1:46 PM    [facility] at [address], [place], [place] [address] Phone: [phone]; Fax: [phone]

## 2019-09-13 ENCOUNTER — Ambulatory Visit (INDEPENDENT_AMBULATORY_CARE_PROVIDER_SITE_OTHER): Payer: Medicare FFS | Admitting: Family Medicine

## 2019-09-13 ENCOUNTER — Encounter: Payer: Self-pay | Admitting: Family Medicine

## 2019-09-13 ENCOUNTER — Other Ambulatory Visit: Payer: Self-pay

## 2019-09-13 VITALS — BP 108/68 | HR 66 | Ht 71.0 in | Wt 149.0 lb

## 2019-09-13 DIAGNOSIS — I480 Paroxysmal atrial fibrillation: Secondary | ICD-10-CM | POA: Diagnosis not present

## 2019-09-13 DIAGNOSIS — I340 Nonrheumatic mitral (valve) insufficiency: Secondary | ICD-10-CM

## 2019-09-13 DIAGNOSIS — I5022 Chronic systolic (congestive) heart failure: Secondary | ICD-10-CM

## 2019-09-13 DIAGNOSIS — I712 Thoracic aortic aneurysm, without rupture, unspecified: Secondary | ICD-10-CM

## 2019-09-13 NOTE — Patient Instructions (Signed)
Medication Instructions:    Your physician recommends that you continue on your current medications as directed. Please refer to the Current Medication list given to you today.  Labwork:  NONE  Testing/Procedures:  NONE  Follow-Up:  Your physician recommends that you schedule a follow-up appointment in: 6 months (office)  Any Other Special Instructions Will Be Listed Below (If Applicable).  If you need a refill on your cardiac medications before your next appointment, please call your pharmacy. 

## 2019-10-11 ENCOUNTER — Telehealth: Payer: Self-pay | Admitting: Cardiology

## 2019-10-11 MED ORDER — ENTRESTO 24-26 MG PO TABS: 1.0000 | ORAL_TABLET | Freq: Two times a day (BID) | ORAL | 1 refills | Status: DC

## 2019-10-11 NOTE — Telephone Encounter (Signed)
Pt c/o medication issue:  1. Name of Medication: sacubitril-valsartan (ENTRESTO) 24-26 MG  2. How are you currently taking this medication (dosage and times per day)?   3. Are you having a reaction (difficulty breathing--STAT)?   4. What is your medication issue? [employer] is calling wanting to know if the patient is still supposed to be on this medication, if so please send a refill request.

## 2019-10-11 NOTE — Telephone Encounter (Signed)
Medication sent to pharmacy  

## 2019-11-15 ENCOUNTER — Encounter: Payer: Self-pay | Admitting: *Deleted

## 2019-11-16 ENCOUNTER — Other Ambulatory Visit: Payer: Self-pay

## 2019-11-16 ENCOUNTER — Ambulatory Visit (INDEPENDENT_AMBULATORY_CARE_PROVIDER_SITE_OTHER): Payer: Medicare FFS | Admitting: Family Medicine

## 2019-11-16 ENCOUNTER — Encounter: Payer: Self-pay | Admitting: Family Medicine

## 2019-11-16 VITALS — BP 122/80 | HR 64 | Ht 71.0 in | Wt 149.4 lb

## 2019-11-16 DIAGNOSIS — I5022 Chronic systolic (congestive) heart failure: Secondary | ICD-10-CM | POA: Diagnosis not present

## 2019-11-16 DIAGNOSIS — I712 Thoracic aortic aneurysm, without rupture, unspecified: Secondary | ICD-10-CM

## 2019-11-16 DIAGNOSIS — I340 Nonrheumatic mitral (valve) insufficiency: Secondary | ICD-10-CM

## 2019-11-16 DIAGNOSIS — I480 Paroxysmal atrial fibrillation: Secondary | ICD-10-CM

## 2019-11-16 NOTE — Progress Notes (Signed)
Cardiology Office Note  Date: 11/16/2019   ID: Thomas Garner., DOB 27-Nov-1932, MRN [id number]  PCP:  Lonia Mad, MD  Cardiologist:  Carlyle Dolly, MD Electrophysiologist:  None   Chief Complaint: Follow-up PAF  History of Present Illness: Thomas Mcdanel. is a 86 y.o. male with a history of chronic atrial fibrillation on Eliquis, chronic systolic CHF, MR, aortic aneurysm, chest pain, mediastinal mass (thymoma seeing oncology/benign), history of GI bleed, TIA.,  HTN, GERD, chronic abdominal pain.   Recent visit to [facility] with complaints of abdominal pain.  He was hypokalemic with a potassium of 2.7.  CT of the abdomen and pelvis was unremarkable and suspicious for GERD/gastroenteritis.  Chest x-ray showed questionable mild congestive heart failure with elevated BNP of 916.  He was treated for mild congestive heart failure given IV Lasix.  Also given IV Protonix for abdominal pain with resolution and to follow up with GI  His discharge diagnosis was heart failure.   Past Medical History:  Diagnosis Date  . Atrial fibrillation, permanent ([place])   . Chronic anemia   . Chronic leukopenia   . Congestive heart failure (CHF) ([place])   . Diabetes ([place])   . GERD (gastroesophageal reflux disease)   . Hyperlipidemia   . Hypertension   . Pancytopenia [facility])     Past Surgical History:  Procedure Laterality Date  . 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 CARDIAC CATHETERIZATION N/A 12/03/2015   Procedure: Right/Left Heart Cath and Coronary Angiography;  Surgeon: Peter M Martinique, MD;  Location: [place] CV LAB;  Service: Cardiovascular;  Laterality: N/A;  . PERIPHERAL VASCULAR CATHETERIZATION N/A 12/03/2015   Procedure: Thoracic Aortogram;  Surgeon: Peter M Martinique, MD;  Location: [place] [facility] LAB;  Service: Cardiovascular;  Laterality: N/A;    Current Outpatient Medications  Medication Sig  Dispense Refill  . allopurinol (ZYLOPRIM) 300 MG tablet Take 300 mg by mouth daily.     [name] apixaban (ELIQUIS) 5 MG TABS tablet Take 5 mg by mouth 2 (two) times daily.    [name] docusate sodium (COLACE) 100 MG capsule Take 100-200 mg by mouth daily as needed for mild constipation.    . folic acid (FOLVITE) 1 MG tablet Take 1 mg by mouth daily.     . furosemide (LASIX) 20 MG tablet Take 20 mg by mouth daily as needed.     [name] HYDROcodone-acetaminophen (NORCO) 7.5-325 MG tablet Take 1 tablet by mouth 3 (three) times daily as needed.    . isosorbide mononitrate (IMDUR) 30 MG 24 hr tablet Take 30 mg by mouth every morning.     . metoprolol succinate (TOPROL-XL) 25 MG 24 hr tablet TAKE 1 TABLET TWICE DAILY 180 tablet 2  . omeprazole (PRILOSEC) 40 MG capsule Take 40 mg by mouth daily.     . potassium chloride SA (K-DUR,KLOR-CON) 20 MEQ tablet Take 40 mEq by mouth 2 (two) times daily.     . sacubitril-valsartan (ENTRESTO) 24-26 MG Take 1 tablet by mouth 2 (two) times daily. 180 tablet 1   No current facility-administered medications for this visit.   Allergies:  Patient has no known allergies.   Social History: The patient  reports that he quit smoking about 24 years ago. His smoking use included cigarettes. He started smoking about 70 years ago. He has a 23.00 pack-year smoking history. He has never used smokeless tobacco. He reports that he does not  drink alcohol and does not use drugs.   Family History: The patient's Family history is unknown by patient.   ROS:  Please see the history of present illness. Otherwise, complete review of systems is positive for none.  All other systems are reviewed and negative.   Physical Exam: VS:  BP 122/80   Pulse 64   Ht 5\' 11"  (1.803 m)   Wt 149 lb 6.4 oz (67.8 kg)   BMI 20.84 kg/m , BMI Body mass index is 20.84 kg/m.  Wt Readings from Last 3 Encounters:  11/16/19 149 lb 6.4 oz (67.8 kg)  09/13/19 149 lb (67.6 kg)  06/18/19 159 lb (72.1 kg)    General:  Patient appears comfortable at rest. Neck: Supple, no elevated JVP or carotid bruits, no thyromegaly. Lungs: Clear to auscultation, nonlabored breathing at rest. Cardiac: Regular rate and rhythm, no S3 or significant systolic murmur, no pericardial rub. Extremities: No pitting edema, distal pulses 2+. Skin: Warm and dry. Musculoskeletal: No kyphosis. Neuropsychiatric: Alert and oriented x3, affect grossly appropriate.  ECG:  November 05, 2019 EKG from outside facility : Atrial fibrillation rate of 94 with LBBB 's Recent Labwork: No results found for requested labs within last 8760 hours.  No results found for: CHOL, TRIG, HDL, CHOLHDL, VLDL, LDLCALC, LDLDIRECT  Other Studies Reviewed Today:  Echo 7/1//2021 [place]  1. normal LV size with mildly decreased systolic function.  Ejection fraction is 40 to 45%.  There is moderate concentric hypertrophy.  Monophasic LV diastolic filling pattern due to atrial fibrillation.  Indeterminate LV filling pressure. 2..  Normal right ventricular size with normal function.  RA pressure estimated as normal.  RVSP normal. 3.  Moderately dilated left atrium. 4.  Moderately dilated right atrium. 5. There is mild aortic very eccentric regurgitation. 6.  There is mild anterior mitral valve prolapse associated with moderate posterior mitral regurgitation. 7.  There is moderate tricuspid regurgitation.   CT abdomen pelvis with IV contrast 09/08/2019 for flank pain, CVA tenderness and weakness, unspecified abdominal pain Impression: 1. Multiple renal cysts, some with septations and one of indeterminate density, which are incompletely characterized given the protocol. A dedicated renal CT without and with intravenous contrast is recommended for further characterization.. 2. Nodular focus along the ascending colon near the hepatic flexure, likely related to history of metastatic carcinoid. 3. Enlarged mesenteric lymph nodes, previously  described as hypermetabolic on PET.  Diagnostic Studies  11/2015 LHC/RHC Prox LAD to Mid LAD lesion, 20 %stenosed. There is severe left ventricular systolic dysfunction. The left ventricular ejection fraction is less than 25% by visual estimate. There is mild (2+) mitral regurgitation. LV end diastolic pressure is normal. LV end diastolic pressure is normal.  1. Minor nonobstructive CAD 2. Severe LV enlargement with markedly reduced LV systolic function. 3. Moderate dilation of proximal aorta. 4. Normal right heart pressures, normal LV filling pressures.  5. Mild MR Aortic Root: The aortic root displays moderate dilation. Plan: medical management.    Jan 2018 Echo [facility] LVEF 25-30%, severe MR.    09/2017 echo Study Conclusions  - Left ventricle: The cavity size was normal. Wall thickness was increased in a pattern of mild LVH. Systolic function was mildly to moderately reduced. The estimated ejection fraction was in the range of 40% to 45%. Diffuse hypokinesis. There is severe hypokinesis of the basalinferior myocardium. Indeterminate diastolic function. - Aortic valve: There was mild regurgitation. - Aorta: Aortic root dimension: 47 mm (ED). - Aortic root: The  aortic root was moderately dilated. - Mitral valve: Mildly thickened leaflets. Mild prolapse, involving the anterior leaflet. There was severe regurgitation directed posteriorly. Effective regurgitant orifice (PISA): 0.41 cm^2. Regurgitant volume (PISA): 79 ml. - Left atrium: The atrium was severely dilated. - Right ventricle: Systolic function was mildly reduced. - Right atrium: Central venous pressure (est): 3 mm Hg. - Atrial septum: No defect or patent foramen ovale was identified. - Tricuspid valve: There was mild regurgitation. - Pulmonary arteries: PA peak pressure: 43 mm Hg (S). - Pericardium, extracardiac: There was no pericardial effusion.  Assessment and Plan:  1.  Paroxysmal atrial fibrillation (HCC)   2. Chronic systolic heart failure ([place])   3. Severe mitral regurgitation   4. Thoracic aortic aneurysm without rupture ([place])    1. Paroxysmal atrial fibrillation (HCC)  Today HR 71 with regular rhythm. Denies any palpitations or arrhythmias. Recent EKG from hospital admission November 05, 2019 [place], [place] shows atrial fibrillation rate of 94. Continue Toprol XL 25 mg po bid, Eliquis 5 mg bid.  2. Chronic systolic heart failure ([place])  Echo 09/2017 Estimated ejection fraction was in the range of 40% to 45%. Diffuse hypokinesis. There is severe hypokinesis of the basalinferior myocardium. Indeterminate diastolic function. Continue Lasix 20 mg as needed,  Imdur 30 mg daily, Toprol XL 25 mg po bid, Entresto 24/26 mg po bid  3. Severe mitral regurgitation Echo 6/20219 - Mitral valve: Mildly thickened leaflets. Mild prolapse, involving the anterior leaflet. There was severe regurgitation directed posteriorly. Effective regurgitant orifice (PISA): 0.41 cm^2. Regurgitant volume (PISA): 79 ml. Will follow with yearly   surveillance echocardiograms  4. Thoracic aortic aneurysm without rupture ([place])  Echo 09/2017 Aorta: Aortic root dimension: 47 mm (ED). - Aortic root: The aortic root was moderately dilated. Will follow with yearly Chest CT and / or Echocardiograms.  Medication Adjustments/Labs and Tests Ordered: Current medicines are reviewed at length with the patient today.  Concerns regarding medicines are outlined above.   Disposition: Follow-up with Dr Harl Bowie or APP 6 months.  Signed, Levell July, NP 11/16/2019 2:37 PM    [facility] at [place], [place], [place] [address] Phone: [phone]; Fax: [phone]

## 2019-11-16 NOTE — Patient Instructions (Signed)
Medication Instructions:  Continue all current medications.   Labwork: none  Testing/Procedures: none  Follow-Up: 6 months   Any Other Special Instructions Will Be Listed Below (If Applicable).   If you need a refill on your cardiac medications before your next appointment, please call your pharmacy.  

## 2019-11-17 ENCOUNTER — Encounter: Payer: Self-pay | Admitting: Family Medicine

## 2019-11-21 ENCOUNTER — Encounter: Payer: Self-pay | Admitting: Cardiology

## 2020-01-01 ENCOUNTER — Ambulatory Visit: Payer: Medicare FFS | Admitting: Cardiology

## 2020-02-28 ENCOUNTER — Other Ambulatory Visit: Payer: Self-pay | Admitting: Cardiology

## 2020-03-17 ENCOUNTER — Telehealth: Payer: Self-pay | Admitting: Cardiology

## 2020-03-17 NOTE — Telephone Encounter (Signed)
Pt son was concerned about pt taking Entresto and Eliquis - explained that Eliquis was for afib and entresto was for heart failure - pt son says pt was taken off of Entresto by another provider (says he was told he could not take both Eliquis and Entresto) per most recent notes pt was to be taking both Entresto and Eliquis and would need to clarify what provider stopped Entresto and for exactly what reason - pt son requested pt be seen by Dr Harl Bowie to discuss - pt scheduled for January 2022 - notes from [facility] in [place] do not mention Delene Loll being stopped

## 2020-03-17 NOTE — Telephone Encounter (Signed)
New message   Patient son called wants to know why it still says his day is taking the Entresto and the Eliquis, this needs to be fixed , his dad is getting both medications in the mail and its confusing him

## 2020-03-18 ENCOUNTER — Encounter: Payer: Self-pay | Admitting: *Deleted

## 2020-03-18 NOTE — Telephone Encounter (Signed)
Records received from [facility] who stopped Entresto for creatinine at 2.08 and pt was to f/u in 2 weeks for repeat labs and kidney US - pt is scheduled for [place] 12/8 and had repeat labs done at pcp (will request) 2 weeks ago and was told by pcp Dr Delman Kitten to remain off of Delene Loll - pt is already scheduled for f/u with Dr Harl Bowie is January (was first available) - spoke with pt son and made him aware that we received these records and follow current pcp/nephrologist advise on medications until appt with Dr Harl Bowie and will forward this message as Juluis Rainier and pt records placed on providers desk

## 2020-03-25 ENCOUNTER — Ambulatory Visit: Payer: Medicare FFS | Admitting: Cardiology

## 2020-05-16 ENCOUNTER — Other Ambulatory Visit: Payer: Self-pay

## 2020-05-16 ENCOUNTER — Encounter: Payer: Self-pay | Admitting: Cardiology

## 2020-05-16 ENCOUNTER — Ambulatory Visit (INDEPENDENT_AMBULATORY_CARE_PROVIDER_SITE_OTHER): Payer: Medicare FFS | Admitting: Cardiology

## 2020-05-16 VITALS — BP 106/80 | HR 68 | Ht 71.0 in | Wt 149.2 lb

## 2020-05-16 DIAGNOSIS — I480 Paroxysmal atrial fibrillation: Secondary | ICD-10-CM

## 2020-05-16 DIAGNOSIS — I340 Nonrheumatic mitral (valve) insufficiency: Secondary | ICD-10-CM

## 2020-05-16 DIAGNOSIS — I5022 Chronic systolic (congestive) heart failure: Secondary | ICD-10-CM

## 2020-05-16 MED ORDER — APIXABAN 5 MG PO TABS: 5.0000 mg | ORAL_TABLET | Freq: Two times a day (BID) | ORAL | 0 refills | Status: AC

## 2020-05-16 NOTE — Patient Instructions (Signed)

## 2020-05-16 NOTE — Progress Notes (Signed)
Clinical Summary Mr. Lorenzini is a 87 y.o.male seen today for follow up of the following medical problems.   1. Afib - long history of afib according to notes - recent afib with RVR during 09/2015 admission at [facility] - previous hematemsis when on anticoagulant in the remote past. - mention of sick sinus syndrome in the past, the specificis are not well described. .     - no palpitations - no bleeding on eliquis  2. GI bleedinghistory - several old records reviewed to better understand his GI bleeding history including records from [place] and [place] - In 2010 admitted to [facility] with GI bleed. Received 2 units of pRBCs. EGD and colonoscopy unremarkable.  In 2012 patient was in [facility] with hematocheazia. He had EGD and Colonoscopy by Dr.Oneil and Capsule endoscopy by Dr.Fein which was negative. He had a bleeding scan which was negative. Similar things happened few years ago when he went to [facility] and w/up was negative.  He bleeding restarted and he is having hematocheazia again x 2  He was originally on coumadin for afib - Pt had no further GIB while in hospital and had stable Hgb. EGD/colo were negative. Capsule study done and results pending. Dr. Curly [place] to call pt w/ results. Pt recommended to remain off coumadin given h/o GIB with no source found at this time. Ok to d/c home today. - admit 02/2012 with GI bleed while off anticoag, thought to be diverticular bleed. Received 2 units of pRBCs. Colonoscopy with large clots in cecum.   - has tolerated anticoagulation thus far without bleeding troubles. - denies any recent bleeding   3. History of TIA - acute episode of slurred speech, facial droop, confusion during recent admission at [facility] - now on eliquis.     4. Chronic systolic HF - admit [id number] with CHF to [facility], appears to be new diagnosis -09/2015 echo [facility] LVEF 20-25%, moderate AI, modertate to severe MR,  -  clinic note from 2013 mentions LVEF 55-60%, moderate MR.  - EKG LBBB of unknown duration.  - cath 11/2015 with mild nonobstructive CAD, CI 2.2, mean PA 23, PCWP 11 - Jan 2018 echo [place]: LVEF 25-30%, mild RV dysfunction, severe MR, mod TR, restrictive diastolic function  [id number] echo LVEF 40-45%   05/2019 echo LVEF 50-55%, severe MR.   - Admission 11/2019 with AKI in setting of poor oral intake. Cr up to 2 - entresto was stopped - followed up with renal as outpatient, Cr back down to 1.1  - no recent edema. No SOB or DOE. Weights are stable -   5. 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 Negative for malignancy      6Mitral regurgitation - long history of MR followed by previous cardiologist - by echo 09/2015 reported moderate to severe - echo Jan 2018 [facility] severe MR.   -09/2017 echo LVEF 40-45%, severe MR. Prolapse of anterior leaflet with severe posterior MR  05/2019 echo LVEF 50-55%, severe MR, LVIDs 3.8  7. 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 Unclear if missed dilated portion.  - CT PE [facility] 09/2015 with mildy dilated ascedning aorta at 4.2 cm.  - 09/2017 echo aortic root 4.7 cm Jan 2020 [facility] aortic root 5cm, ascneding aorta 3.8 cm.  - 05/2019 aortic root 4.7 cm.  Past Medical History:  Diagnosis Date  . Atrial fibrillation, permanent ([place])   . Chronic anemia   . Chronic leukopenia   . Congestive heart failure (CHF) ([place])   . Diabetes ([place])   . GERD (gastroesophageal reflux disease)   . Hyperlipidemia   . Hypertension   . Pancytopenia ([place])      No Known Allergies   Current Outpatient Medications  Medication Sig Dispense Refill  . allopurinol (ZYLOPRIM) 300 MG tablet Take 300 mg by mouth daily.     [name] apixaban (ELIQUIS) 5 MG TABS tablet Take 5  mg by mouth 2 (two) times daily.    [name] docusate sodium (COLACE) 100 MG capsule Take 100-200 mg by mouth daily as needed for mild constipation.    . folic acid (FOLVITE) 1 MG tablet Take 1 mg by mouth daily.     . furosemide (LASIX) 20 MG tablet Take 20 mg by mouth daily as needed.     [name] HYDROcodone-acetaminophen (NORCO) 7.5-325 MG tablet Take 1 tablet by mouth 3 (three) times daily as needed.    . isosorbide mononitrate (IMDUR) 30 MG 24 hr tablet Take 30 mg by mouth every morning.     . metoprolol succinate (TOPROL-XL) 25 MG 24 hr tablet TAKE 1 TABLET TWICE DAILY 180 tablet 1  . omeprazole (PRILOSEC) 40 MG capsule Take 40 mg by mouth daily.     . potassium chloride SA (K-DUR,KLOR-CON) 20 MEQ tablet Take 40 mEq by mouth 2 (two) times daily.     . sacubitril-valsartan (ENTRESTO) 24-26 MG Take 1 tablet by mouth 2 (two) times daily. 180 tablet 1   No current facility-administered medications for this visit.     Past Surgical History:  Procedure Laterality Date  . 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 CARDIAC CATHETERIZATION N/A 12/03/2015   Procedure: Right/Left Heart Cath and Coronary Angiography;  Surgeon: Peter M Martinique, MD;  Location: [place] [facility];  Service: Cardiovascular;  Laterality: N/A;  . PERIPHERAL VASCULAR CATHETERIZATION N/A 12/03/2015   Procedure: Thoracic Aortogram;  Surgeon: Peter M Martinique, MD;  Location: [place] [facility];  Service: Cardiovascular;  Laterality: N/A;     No Known Allergies    Family History  Family history unknown: Yes     Social History Mr. Borquez reports that he quit smoking about 25 years ago. His smoking use included cigarettes. He started smoking about 70 years ago. He has a 23.00 pack-year smoking history. He has never used smokeless tobacco. Mr. Chery reports no history of alcohol use.   Review of Systems CONSTITUTIONAL: No weight loss, fever, chills, weakness  or fatigue.  HEENT: Eyes: No visual loss, blurred vision, double vision or yellow sclerae.No hearing loss, sneezing, congestion, runny nose or sore throat.  SKIN: No rash or itching.  CARDIOVASCULAR: per hpi RESPIRATORY: No shortness of breath, cough or sputum.  GASTROINTESTINAL: No anorexia, nausea, vomiting or diarrhea. No abdominal pain or blood.  GENITOURINARY: No burning on urination, no polyuria NEUROLOGICAL: No headache, dizziness, syncope, paralysis, ataxia, numbness or tingling in the extremities. No change in bowel or bladder control.  MUSCULOSKELETAL: No muscle, back pain, joint pain or stiffness.  LYMPHATICS: No enlarged nodes. No history of splenectomy.  PSYCHIATRIC: No history of depression or anxiety.  ENDOCRINOLOGIC: No reports of sweating, cold or heat intolerance. No polyuria or polydipsia.  [name]   Physical Examination Today's Vitals   05/16/20 1120  BP: 106/80  Pulse: 68  Weight: 149 lb 3.2 oz (67.7 kg)  Height: 5\' 11"  (1.803 m)   Body mass index is 20.81 kg/m.  Gen: resting comfortably, no acute distress HEENT: no scleral icterus, pupils equal round and reactive, no palptable cervical adenopathy,  CV: RRR, no m/r/g, no jvd Resp: Clear to auscultation bilaterally GI: abdomen is soft, non-tender, non-distended, normal bowel sounds, no hepatosplenomegaly MSK: extremities are warm, no edema.  Skin: warm, no rash Neuro:  no focal deficits Psych: appropriate affect   Diagnostic Studies 11/2015 LHC/RHC  Prox LAD to Mid LAD lesion, 20 %stenosed.  There is severe left ventricular systolic dysfunction.  The left ventricular ejection fraction is less than 25% by visual estimate.  There is mild (2+) mitral regurgitation.  LV end diastolic pressure is normal.  LV end diastolic pressure is normal.  1. Minor nonobstructive CAD 2. Severe LV enlargement with markedly reduced LV systolic function. 3. Moderate dilation of proximal aorta. 4. Normal right heart  pressures, normal LV filling pressures.  5. Mild MR Aortic Root: The aortic root displays moderate dilation. Plan: medical management.    Jan 2018 Echo [facility] LVEF 25-30%, severe MR.    09/2017 echo Study Conclusions  - Left ventricle: The cavity size was normal. Wall thickness was increased in a pattern of mild LVH. Systolic function was mildly to moderately reduced. The estimated ejection fraction was in the range of 40% to 45%. Diffuse hypokinesis. There is severe hypokinesis of the basalinferior myocardium. Indeterminate diastolic function. - Aortic valve: There was mild regurgitation. - Aorta: Aortic root dimension: 47 mm (ED). - Aortic root: The aortic root was moderately dilated. - Mitral valve: Mildly thickened leaflets. Mild prolapse, involving the anterior leaflet. There was severe regurgitation directed posteriorly. Effective regurgitant orifice (PISA): 0.41 cm^2. Regurgitant volume (PISA): 79 ml. - Left atrium: The atrium was severely dilated. - Right ventricle: Systolic function was mildly reduced. - Right atrium: Central venous pressure (est): 3 mm Hg. - Atrial septum: No defect or patent foramen ovale was identified. - Tricuspid valve: There was mild regurgitation. - Pulmonary arteries: PA peak pressure: 43 mm Hg (S). - Pericardium, extracardiac: There was no pericardial effusion.    Assessment and Plan  1. PAF -no symptoms, continue current meds  2. Chronic systolic HF -orthostatic symptoms limit medical therapy -LVEF has improved and back in normal range by recent echo, though with severe MR true LVEF would be somewhat lower - off entresto and lasix due to admission few months ago with AKI, poor oral hydration at that time - renal function has normalized. Given improved LVEF, labile oral intake, and clinically doing well will continue current regimen. Could consider low dose ARB in the future, Im not sure would retry entresto.    3. Mitral regurgitation - poor candidate for MVR. His LVEF has actually improved over time, no significant symptoms.  - we will continue to monitor.    F/u 6 months    Arnoldo Lenis, M.D.

## 2020-07-24 ENCOUNTER — Other Ambulatory Visit: Payer: Self-pay | Admitting: Cardiology

## 2020-11-13 ENCOUNTER — Ambulatory Visit: Payer: Medicare FFS | Admitting: Cardiology

## 2020-11-19 NOTE — Progress Notes (Deleted)
Cardiology Office Note  Date: 11/19/2020   ID: Thomas Garner., DOB 01/21/1933, MRN [id number]  PCP:  Lonia Mad, MD  Cardiologist:  Carlyle Dolly, MD Electrophysiologist:  None   Chief Complaint: 57-monthfollow up  History of Present Illness: Thomas Costa is a 87y.o. male with a history of HTN, CAD, aortic valve disorder, atrial fibrillation, chronic systolic CHF, DM2, HLD.  He was last seen by Dr. BHarl Bowieon 05/16/2020.  He had no palpitations or bleeding on Eliquis.  History of GI bleed with no current bleeding.  Tolerating anticoagulation well without any bleeding problems.  History of TIA.  History of chronic systolic heart failure.  Last echo February 2021 with EF of 50 to 55% and severe MR.  Had a hospital admission in August 2021 along with acute kidney injury in setting of poor oral intake with creatinine up to 2.  EDelene Lollwas stopped.  He was following up with nephrology as an outpatient.  Creatinine had decreased back down to 1.1.  History of aortic aneurysm.  Last measurement on February 2021 aortic root of 4.7 cm.  Continuing current meds for PAF.  No symptoms.  Could consider low-dose ARB in the future. Orthostatic symptoms were limiting therapy.  He was deemed to be a poor candidate for mitral valve replacement.  His LVEF had actually improved over time.  He was having no significant symptoms.  Plan was to continue to monitor.  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 28 tablet 0   folic acid (FOLVITE) 1 MG tablet Take 1 mg by mouth daily.      hydrALAZINE (APRESOLINE) 25 MG tablet Take 25 mg by mouth 2 (two) times daily.     isosorbide dinitrate (ISORDIL) 20 MG tablet Take 20 mg by mouth 2 (two) times daily.     magnesium oxide (MAG-OX) 400 MG tablet Take 1 tablet by mouth daily.     metoprolol succinate (TOPROL-XL) 25 MG 24 hr tablet TAKE 1 TABLET TWICE DAILY 180 tablet 1   mirtazapine (REMERON) 15 MG tablet Take 15 mg by mouth at bedtime.     omeprazole (PRILOSEC) 40 MG capsule Take 40 mg by mouth daily.      potassium chloride SA (K-DUR,KLOR-CON) 20 MEQ tablet Take 40 mEq by mouth 2 (two) times daily.      sodium bicarbonate 650 MG tablet Take 650 mg by mouth 2 (two) times daily.     No current facility-administered medications for this  visit.   Allergies:  Patient has no known allergies.   Social History: The patient  reports that he quit smoking about 25 years ago. His smoking use included cigarettes. He started smoking about 71 years ago. He has a 23.00 pack-year smoking history. He has never used smokeless tobacco. He reports that he does not drink alcohol and does not use drugs.   Family History: The patient's Family history is unknown by patient.   ROS:  Please see the history of present illness. Otherwise, complete review of systems is positive for {NONE DEFAULTED:18576}.  All other systems are reviewed and negative.   Physical Exam: VS:  There were no vitals taken for this visit.,  BMI There is no height or weight on file to calculate BMI.  Wt Readings from Last 3 Encounters:  05/16/20 149 lb 3.2 oz (67.7 kg)  11/16/19 149 lb 6.4 oz (67.8 kg)  09/13/19 149 lb (67.6 kg)    General: Patient appears comfortable at rest. HEENT: Conjunctiva and lids normal, oropharynx clear with moist mucosa. Neck: Supple, no elevated JVP or carotid bruits, no thyromegaly. Lungs: Clear to auscultation, nonlabored breathing at rest. Cardiac: Regular rate and rhythm, no S3 or significant systolic murmur, no pericardial rub. Abdomen: Soft, nontender, no hepatomegaly, bowel sounds present, no guarding or rebound. Extremities: No pitting edema, distal pulses 2+. Skin: Warm and dry. Musculoskeletal: No kyphosis. Neuropsychiatric: Alert and oriented x3, affect grossly appropriate.  ECG:  {EKG/Telemetry Strips Reviewed:[id number]}  Recent Labwork: No results found for requested labs within last 8760 hours.  No results found for: CHOL, TRIG, HDL, CHOLHDL, VLDL, LDLCALC, LDLDIRECT  Other Studies Reviewed Today:  11/2015 LHC/RHC Prox LAD to Mid LAD lesion, 20 %stenosed. There is severe left ventricular systolic dysfunction. The left ventricular ejection fraction is less than 25% by visual estimate. There is mild (2+) mitral regurgitation. LV end diastolic pressure is normal. LV end diastolic pressure is normal.   1. Minor nonobstructive CAD 2. Severe LV enlargement with markedly reduced LV systolic function. 3. Moderate dilation of proximal aorta. 4. Normal right heart pressures, normal LV filling pressures. 5. Mild MR  Aortic Root: The aortic root displays moderate dilation. Plan: medical management.     Jan 2018 Echo [facility] LVEF 25-30%, severe MR.     09/2017 echo Study Conclusions   - Left ventricle: The cavity size was normal. Wall thickness was   increased in a pattern of mild LVH. Systolic function was mildly   to moderately reduced. The estimated ejection  fraction was in the   range of 40% to 45%. Diffuse hypokinesis. There is severe   hypokinesis of the basalinferior myocardium. Indeterminate   diastolic function. - Aortic valve: There was mild regurgitation. - Aorta: Aortic root dimension: 47 mm (ED). - Aortic root: The aortic root was moderately dilated. - Mitral valve: Mildly thickened leaflets. Mild prolapse, involving   the anterior leaflet. There was severe regurgitation directed   posteriorly. Effective regurgitant orifice (PISA): 0.41 cm^2.   Regurgitant volume (PISA): 79 ml. - Left atrium: The atrium was severely dilated. - Right ventricle: Systolic function was mildly reduced. - Right atrium: Central venous pressure (est): 3 mm Hg. - Atrial septum: No defect or patent foramen ovale was identified. - Tricuspid valve: There was mild regurgitation. - Pulmonary arteries: PA peak pressure: 43 mm Hg (S). - Pericardium, extracardiac: There was no pericardial effusion.  Assessment and Plan:  1. Chronic systolic heart failure (HCC)   2. Paroxysmal atrial fibrillation ([place])  3. Severe mitral regurgitation    1. Chronic systolic heart failure (HCC) ***  2. Paroxysmal atrial fibrillation (HCC) ***  3. Severe mitral regurgitation ***   Medication Adjustments/Labs and Tests Ordered: Current medicines are reviewed at length with the patient today.  Concerns regarding medicines are outlined above.   Disposition: Follow-up with ***  Signed, Levell July, NP 11/19/2020 6:50 PM    [place] [facility] [place], [place], [place] [id number] Phone: [phone]; Fax: [phone]

## 2020-11-20 ENCOUNTER — Ambulatory Visit: Payer: Medicare FFS | Admitting: Family Medicine

## 2020-11-20 DIAGNOSIS — I340 Nonrheumatic mitral (valve) insufficiency: Secondary | ICD-10-CM

## 2020-11-20 DIAGNOSIS — I5022 Chronic systolic (congestive) heart failure: Secondary | ICD-10-CM

## 2020-11-20 DIAGNOSIS — I480 Paroxysmal atrial fibrillation: Secondary | ICD-10-CM

## 2020-12-11 NOTE — Progress Notes (Signed)
Cardiology Office Note  Date: 12/12/2020   ID: Thomas Garner., DOB [birth year], MRN [id number]  PCP:  Olena Mater, MD  Cardiologist:  Carlyle Dolly, MD Electrophysiologist:  None   Chief Complaint: 26-monthfollow up  History of Present Illness: Thomas Costa is a 87y.o. male with a history of HTN, CAD, aortic valve disorder, atrial fibrillation, chronic systolic CHF, DM2, HLD.  He was last seen by Dr. BHarl Bowieon 05/16/2020.  He had no palpitations or bleeding on Eliquis.  History of GI bleed with no current bleeding.  Tolerating anticoagulation well without any bleeding problems.  History of TIA.  History of chronic systolic heart failure.  Last echo February 2021 with EF of 50 to 55% and severe MR.  Had a hospital admission in August 2021 along with acute kidney injury in setting of poor oral intake with creatinine up to 2.  EDelene Lollwas stopped.  He was following up with nephrology as an outpatient.  Creatinine had decreased back down to 1.1.  History of aortic aneurysm.  Last measurement on February 2021 aortic root of 4.7 cm.  Continuing current meds for PAF.  No symptoms.  Could consider low-dose ARB in the future. Orthostatic symptoms were limiting therapy.  He was deemed to be a poor candidate for mitral valve replacement.  His LVEF had actually improved over time.  He was having no significant symptoms.  Plan was to continue to monitor.  He is here today for 638-monthollow-up.  His primary complaint is feeling weak and some minor shortness of breath.  He denies any anginal symptoms.  EKG today shows atrial fibrillation with a rate of 68.  Blood pressure is on the soft side at 100/60.  Current antihypertensive medications include hydralazine 25 mg p.o. daily, Toprol-XL 25 mg daily.  Currently taking Eliquis 5 mg p.o. twice daily without any bleeding.  Continuing Isordil 20 mg p.o. twice daily.  Potassium 40 mEq p.o. twice daily.  He denies any weight gain.  Today he weighs 145  pounds.  No complaints of edema.  He had recent lab work with his primary care provider on 12/01/2020 with a lipid profile: TG 73, TC 138, HDL 59, LDL 63.5 C-Met demonstrating creatinine of 1.42, GFR of 51.  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     apixaban (ELIQUIS) 5 MG TABS tablet Take 1 tablet (5 mg total) by mouth 2 (two) times daily. 28 tablet 0   folic acid (FOLVITE)  1 MG tablet Take 1 mg by mouth daily.      isosorbide dinitrate (ISORDIL) 20 MG tablet Take 20 mg by mouth 2 (two) times daily.     magnesium oxide (MAG-OX) 400 MG tablet Take 1 tablet by mouth daily.     metoprolol succinate (TOPROL-XL) 25 MG 24 hr tablet TAKE 1 TABLET TWICE DAILY 180 tablet 1   mirtazapine (REMERON) 15 MG tablet Take 15 mg by mouth at bedtime.     potassium chloride SA (K-DUR,KLOR-CON) 20 MEQ tablet Take 40 mEq by mouth 2 (two) times daily.      dexlansoprazole (DEXILANT) 60 MG capsule Take 60 mg by mouth daily.     hydrALAZINE  (APRESOLINE) 25 MG tablet Take 0.5 tablets (12.5 mg total) by mouth daily.     sodium bicarbonate 650 MG tablet Take 650 mg by mouth 2 (two) times daily. (Patient not taking: Reported on 12/12/2020)     No current facility-administered medications for this visit.   Allergies:  Patient has no known allergies.   Social History: The patient  reports that he quit smoking about 25 years ago. His smoking use included cigarettes. He started smoking about 71 years ago. He has a 23.00 pack-year smoking history. He has never used smokeless tobacco. He reports that he does not drink alcohol and does not use drugs.   Family History: The patient's Family history is unknown by patient.   ROS:  Please see the history of present illness. Otherwise, complete review of systems is positive for none.  All other systems are reviewed and negative.   Physical Exam: VS:  BP 100/60   Pulse 72   Ht '5\' 11"'  (1.803 m)   Wt 145 lb (65.8 kg)   SpO2 96%   BMI 20.22 kg/m , BMI Body mass index is 20.22 kg/m.  Wt Readings from Last 3 Encounters:  12/12/20 145 lb (65.8 kg)  05/16/20 149 lb 3.2 oz (67.7 kg)  11/16/19 149 lb 6.4 oz (67.8 kg)    General: Patient appears comfortable at rest. Neck: Supple, no elevated JVP or carotid bruits, no thyromegaly. Lungs: Clear to auscultation, nonlabored breathing at rest. Cardiac: Irregularly irregular rate and rhythm, no S3 or significant systolic murmur, no pericardial rub. Extremities: No pitting edema, distal pulses 2+. Skin: Warm and dry. Musculoskeletal: No kyphosis. Neuropsychiatric: Alert and oriented x3, affect grossly appropriate.  ECG: 12/12/2020 EKG atrial fibrillation with a rate of 68, left axis deviation, left bundle branch block.  Recent Labwork: No results found for requested labs within last 8760 hours.  No results found for: CHOL, TRIG, HDL, CHOLHDL, VLDL, LDLCALC, LDLDIRECT  Other Studies Reviewed Today:   Echocardiogram [place]  11/13/2019 Normal LV size with mildly decreased systolic function.  The ejection fraction was 40 to 45%.  There is moderate concentric hypertrophy, monophasic LV diastolic filling pattern due to atrial fibrillation.  Indeterminate LV filling pressure.  Normal right ventricular size with normal function.  RA pressure estimated as normal, RVSP normal.  Moderately dilated left atrium.  Moderately dilated right atrium.  There is mild aortic very eccentric regurgitation.  There is mild anterior mitral valve prolapse associated with moderate posterior mitral regurgitation.  There is moderate tricuspid regurgitation.  11/2015 LHC/RHC Prox LAD to Mid LAD lesion, 20 %stenosed. There is severe left ventricular systolic dysfunction. The left ventricular ejection fraction is less than 25% by visual estimate. There is mild (2+) mitral regurgitation. LV end diastolic pressure is normal. LV end diastolic pressure  is normal.   1. Minor nonobstructive CAD 2. Severe LV enlargement with markedly reduced LV systolic function. 3. Moderate dilation of proximal aorta. 4. Normal right heart pressures, normal LV filling pressures. 5. Mild MR  Aortic Root: The aortic root displays moderate dilation. Plan: medical management.     Jan 2018 Echo [facility] LVEF 25-30%, severe MR.     09/2017 echo Study Conclusions   - Left ventricle: The cavity size was normal. Wall thickness was   increased in a pattern of mild LVH. Systolic function was mildly   to moderately reduced. The estimated ejection fraction was in the   range of 40% to 45%. Diffuse hypokinesis. There is severe   hypokinesis of the basalinferior myocardium. Indeterminate   diastolic function. - Aortic valve: There was mild regurgitation. - Aorta: Aortic root dimension: 47 mm (ED). - Aortic root: The aortic root was moderately dilated. - Mitral valve: Mildly thickened leaflets. Mild prolapse, involving   the anterior leaflet. There was severe  regurgitation directed   posteriorly. Effective regurgitant orifice (PISA): 0.41 cm^2.   Regurgitant volume (PISA): 79 ml. - Left atrium: The atrium was severely dilated. - Right ventricle: Systolic function was mildly reduced. - Right atrium: Central venous pressure (est): 3 mm Hg. - Atrial septum: No defect or patent foramen ovale was identified. - Tricuspid valve: There was mild regurgitation. - Pulmonary arteries: PA peak pressure: 43 mm Hg (S). - Pericardium, extracardiac: There was no pericardial effusion.  Assessment and Plan:  1. Chronic systolic heart failure (HCC)   2. Paroxysmal atrial fibrillation ([place])   3. Severe mitral regurgitation   4. Cardiomyopathy, unspecified type ([place])     1. Chronic systolic heart failure ([place]) Last echocardiogram on 11/13/2019 at [facility] demonstrated EF of 40 to 45%, moderate concentric hypertrophy.  Moderately dilated left atrium, mildly dilated right atrium.  Mild aortic variance history regurgitation.  Mild anterior mitral prolapse associated with moderate posterior mitral regurgitation, moderate tricuspid regurgitation.  He states he is feeling a little more weak with some mild DOE.  He is not very active on a daily basis.  Please get a repeat echocardiogram.  Blood pressure is soft today.  Please decrease hydralazine to 12.5 mg daily.  Continue Isordil 20 mg p.o. twice daily.  Continue potassium 40 mEq p.o. twice daily.  2. Paroxysmal atrial fibrillation (HCC) Currently in atrial fibrillation today with EKG demonstrating atrial fibrillation with a rate of 68.  Left axis deviation, left bundle branch block.  Continue Eliquis 5 mg p.o. twice daily.  Continue Toprol-XL 25 mg p.o. daily.  3. Severe mitral regurgitation Previous echo at [facility] 2018 showed severe mitral regurgitation with EF of 25 to 30%.  [facility] showed moderate posterior mitral regurgitation.  Please get a repeat  echocardiogram to assess LV function, diastolic function and valvular function.  Medication Adjustments/Labs and Tests Ordered: Current medicines are reviewed at length with the patient today.  Concerns regarding medicines are outlined above.   Disposition: Follow-up with Dr. Harl Bowie or APP 3 months  Signed, Levell July, NP 12/12/2020 11:15 AM    [facility] [place], [place], [place] [address] Phone: [phone]; Fax: [phone]

## 2020-12-12 ENCOUNTER — Ambulatory Visit (INDEPENDENT_AMBULATORY_CARE_PROVIDER_SITE_OTHER): Payer: Medicare PPO | Admitting: Family Medicine

## 2020-12-12 ENCOUNTER — Encounter: Payer: Self-pay | Admitting: Family Medicine

## 2020-12-12 ENCOUNTER — Other Ambulatory Visit: Payer: Self-pay

## 2020-12-12 VITALS — BP 100/60 | HR 72 | Ht 71.0 in | Wt 145.0 lb

## 2020-12-12 DIAGNOSIS — I340 Nonrheumatic mitral (valve) insufficiency: Secondary | ICD-10-CM

## 2020-12-12 DIAGNOSIS — I480 Paroxysmal atrial fibrillation: Secondary | ICD-10-CM | POA: Diagnosis not present

## 2020-12-12 DIAGNOSIS — I429 Cardiomyopathy, unspecified: Secondary | ICD-10-CM | POA: Diagnosis not present

## 2020-12-12 DIAGNOSIS — I5022 Chronic systolic (congestive) heart failure: Secondary | ICD-10-CM

## 2020-12-12 MED ORDER — HYDRALAZINE HCL 25 MG PO TABS: 12.5000 mg | ORAL_TABLET | Freq: Every day | ORAL | Status: DC

## 2020-12-12 NOTE — Patient Instructions (Addendum)
Medication Instructions:  Decrease Hydralazine to 12.'5mg'$  DAILY  Continue all other current medications.   Labwork: none  Testing/Procedures: Your physician has requested that you have an echocardiogram. Echocardiography is a painless test that uses sound waves to create images of your heart. It provides your doctor with information about the size and shape of your heart and how well your heart's chambers and valves are working. This procedure takes approximately one hour. There are no restrictions for this procedure. Office will contact with results via phone or letter.     Follow-Up: 3 months   Any Other Special Instructions Will Be Listed Below (If Applicable).    If you need a refill on your cardiac medications before your next appointment, please call your pharmacy.

## 2020-12-12 NOTE — Addendum Note (Signed)
Addended by: Laurine Blazer on: 12/12/2020 11:25 AM   Modules accepted: Orders

## 2020-12-18 ENCOUNTER — Other Ambulatory Visit: Payer: Self-pay | Admitting: Cardiology

## 2021-01-07 ENCOUNTER — Other Ambulatory Visit: Payer: Self-pay

## 2021-01-07 ENCOUNTER — Ambulatory Visit (INDEPENDENT_AMBULATORY_CARE_PROVIDER_SITE_OTHER): Payer: Medicare PPO

## 2021-01-07 DIAGNOSIS — I340 Nonrheumatic mitral (valve) insufficiency: Secondary | ICD-10-CM | POA: Diagnosis not present

## 2021-01-07 DIAGNOSIS — I429 Cardiomyopathy, unspecified: Secondary | ICD-10-CM | POA: Diagnosis not present

## 2021-01-07 LAB — ECHOCARDIOGRAM COMPLETE
AV Vena cont: 0.69 cm
Area-P 1/2: 3.06 cm2
Calc EF: 28 %
MV M vel: 4.42 m/s
MV Peak grad: 78 mmHg
P 1/2 time: 397 msec
Radius: 1 cm
S' Lateral: 4.76 cm
Single Plane A2C EF: 18.8 %
Single Plane A4C EF: 37.6 %

## 2021-01-27 ENCOUNTER — Telehealth: Payer: Self-pay | Admitting: *Deleted

## 2021-01-27 NOTE — Telephone Encounter (Signed)
Laurine Blazer, LPN  45/0/3888  2:80 PM EDT Back to Top    Notified, copy to pcp.

## 2021-01-27 NOTE — Telephone Encounter (Signed)
-----   Message from Arnoldo Lenis, MD sent at 01/27/2021  4:37 PM EDT ----- Echo shows heart pumping function is mildly decreased but overall stable. Moderately leaky heart valves overall unchanged.   Zandra Abts MD

## 2021-02-05 ENCOUNTER — Other Ambulatory Visit: Payer: Medicare PPO

## 2021-03-15 NOTE — Progress Notes (Signed)
Cardiology Office Note  Date: 03/16/2021   ID: Thomas Costa., DOB Jun 19, 1932, MRN [id number]  PCP:  Olena Mater, MD  Cardiologist:  Carlyle Dolly, MD Electrophysiologist:  None   Chief Complaint: 90-month follow up  History of Present Illness: Thomas Costa. is a 87 y.o. male with a history of HTN, CAD, aortic valve disorder, atrial fibrillation, chronic systolic CHF, DM2, HLD.  He was last seen by Dr. Harl Bowie on 05/16/2020.  He had no palpitations or bleeding on Eliquis.  History of GI bleed with no current bleeding.  Tolerating anticoagulation well without any bleeding problems.  History of TIA.  History of chronic systolic heart failure.  Last echo February 2021 with EF of 50 to 55% and severe MR.  Had a hospital admission in August 2021 along with acute kidney injury in setting of poor oral intake with creatinine up to 2.  Delene Loll was stopped.  He was following up with nephrology as an outpatient.  Creatinine had decreased back down to 1.1.  History of aortic aneurysm.  Last measurement on February 2021 aortic root of 4.7 cm.  Continuing current meds for PAF.  No symptoms.  Could consider low-dose ARB in the future. Orthostatic symptoms were limiting therapy.  He was deemed to be a poor candidate for mitral valve replacement.  His LVEF had actually improved over time.  He was having no significant symptoms.  Plan was to continue to monitor.  At his prior visit his primary complaint was feeling weak and some minor shortness of breath.  He denied any anginal symptoms.  EKG showed atrial fibrillation with a rate of 68.  Blood pressure was soft at 100/60.  Antihypertensive medications included hydralazine 25 mg p.o. daily, Toprol-XL 25 mg daily.  Currently taking Eliquis 5 mg p.o. twice daily without any bleeding.  Continuing Isordil 20 mg p.o. twice daily.  Potassium 40 mEq p.o. twice daily.  He denies any weight gain.    He is here today for follow-up on recent echocardiogram and  blood pressure.  Blood pressure has improved some since last visit.  Today's blood pressure is 118/68 compared to previous visit when it was 100/60.  We previously decreased hydralazine from 25 to 12.5 mg daily.  Patient's son states they have been having trouble cutting the pill in half.  We discussed we may just decrease the Toprol to 12.5 versus the hydralazine since this would be easier to cut in half.  Patient states he feels a little better and has less shortness of breath.  We discussed the results of the recent echocardiogram patient and son verbalized understanding.  He denies any cardiac related issues today.  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     apixaban (ELIQUIS) 5 MG TABS tablet Take 1 tablet (5 mg total) by mouth 2 (two) times daily. 28 tablet 0   dexlansoprazole (DEXILANT) 60 MG capsule Take 60 mg by mouth daily.     folic acid (FOLVITE) 1 MG tablet Take 1 mg by mouth daily.      isosorbide dinitrate (ISORDIL) 20 MG tablet Take 20 mg by mouth 2 (two)  times daily.     magnesium oxide (MAG-OX) 400 MG tablet Take 1 tablet by mouth daily.     mirtazapine (REMERON) 15 MG tablet Take 15 mg by mouth at bedtime.     potassium chloride SA (K-DUR,KLOR-CON) 20 MEQ tablet Take 40 mEq by mouth 2 (two) times daily.      sodium bicarbonate 650 MG tablet Take 650 mg by mouth 2 (two) times daily.     hydrALAZINE (APRESOLINE) 25 MG tablet Take 1 tablet (25 mg total) by mouth daily. 30 tablet 6   metoprolol succinate (TOPROL-XL) 25 MG 24 hr tablet Take 0.5 tablets (12.5 mg total) by mouth daily.     No current facility-administered medications for this visit.   Allergies:  Patient has no known allergies.   Social History: The patient  reports that he quit smoking about 25 years ago. His smoking use included cigarettes. He started smoking about 71 years ago. He has a 23.00 pack-year smoking history. He has never used smokeless tobacco. He reports that he does not drink alcohol and does not use drugs.   Family History: The patient's Family history is unknown by patient.   ROS:  Please see the history of present illness. Otherwise, complete review of systems is positive for none.  All other systems are reviewed and negative.   Physical Exam: VS:  BP 118/68   Pulse 73   Ht 5\' 11"  (1.803 m)   Wt 142 lb 9.6 oz (64.7 kg)   SpO2 96%   BMI 19.89 kg/m , BMI Body mass index is 19.89 kg/m.  Wt Readings from Last 3 Encounters:  03/16/21 142 lb 9.6 oz (64.7 kg)  12/12/20 145 lb (65.8 kg)  05/16/20 149 lb 3.2 oz (67.7 kg)    General: Patient appears comfortable at rest. Neck: Supple, no elevated JVP or carotid bruits, no thyromegaly. Lungs: Clear to auscultation, nonlabored breathing at rest. Cardiac: Irregularly irregular rate and rhythm, no S3 or significant systolic murmur, no pericardial rub. Extremities: No pitting edema, distal pulses 2+. Skin: Warm and dry. Musculoskeletal: No kyphosis. Neuropsychiatric: Alert and oriented x3, affect grossly  appropriate.  ECG: 12/12/2020 EKG atrial fibrillation with a rate of 68, left axis deviation, left bundle branch block.  Recent Labwork: No results found for requested labs within last 8760 hours.  No results found for: CHOL, TRIG, HDL, CHOLHDL, VLDL, LDLCALC, LDLDIRECT  Other Studies Reviewed Today:   Echocardiogram 01/07/2021  1. Left ventricular ejection fraction, by estimation, is 40%. The left  ventricle has normal function. The left ventricle demonstrates global  hypokinesis. Left ventricular diastolic parameters are indeterminate.   2. Right ventricular systolic function is normal. The right ventricular  size is normal. There is moderately elevated pulmonary artery systolic  pressure.   3. Left atrial size was severely dilated.   4. Right atrial size was mild to moderately dilated.   5.  The MR jet is eccentric and posterior, which may lead to  underestimation of severity. . The mitral valve is abnormal. Mild to  moderate mitral valve regurgitation. No evidence of mitral stenosis.   6. The aortic valve is tricuspid. There is mild calcification of the  aortic valve. There is mild thickening of the aortic valve. Aortic valve  regurgitation is mild to moderate. No aortic stenosis is present.   7. Aortic dilatation noted. There is moderate dilatation of the aortic  root, measuring 45 mm. There is moderate dilatation of the ascending  aorta, measuring 41 mm.   8. The inferior vena cava is dilated in size with <50% respiratory  variability, suggesting right atrial pressure of 15 mmHg.   Comparison(s): 11/03/19 [place] Echo showed LV EF 40-45%, moderate  LVH, biatrial enlargement, MVP with moderate MR, mild eccentric AI,  moderate TR.  06/13/19 [place] Echo showed AoRoot 4.7 cm, ascending aorta 3.95 cm.    Echocardiogram [facility] 11/13/2019 Normal LV size with mildly decreased systolic function.  The ejection fraction was 40 to 45%.  There is moderate  concentric hypertrophy, monophasic LV diastolic filling pattern due to atrial fibrillation.  Indeterminate LV filling pressure.  Normal right ventricular size with normal function.  RA pressure estimated as normal, RVSP normal.  Moderately dilated left atrium.  Moderately dilated right atrium.  There is mild aortic very eccentric regurgitation.  There is mild anterior mitral valve prolapse associated with moderate posterior mitral regurgitation.  There is moderate tricuspid regurgitation.  11/2015 LHC/RHC Prox LAD to Mid LAD lesion, 20 %stenosed. There is severe left ventricular systolic dysfunction. The left ventricular ejection fraction is less than 25% by visual estimate. There is mild (2+) mitral regurgitation. LV end diastolic pressure is normal. LV end diastolic pressure is normal.   1. Minor nonobstructive CAD 2. Severe LV enlargement with markedly reduced LV systolic function. 3. Moderate dilation of proximal aorta. 4. Normal right heart pressures, normal LV filling pressures. 5. Mild MR  Aortic Root: The aortic root displays moderate dilation. Plan: medical management.     Jan 2018 Echo [facility] LVEF 25-30%, severe MR.     09/2017 echo Study Conclusions   - Left ventricle: The cavity size was normal. Wall thickness was   increased in a pattern of mild LVH. Systolic function was mildly   to moderately reduced. The estimated ejection fraction was in the   range of 40% to 45%. Diffuse hypokinesis. There is severe   hypokinesis of the basalinferior myocardium. Indeterminate   diastolic function. - Aortic valve: There was mild regurgitation. - Aorta: Aortic root dimension: 47 mm (ED). - Aortic root: The aortic root was moderately dilated. - Mitral valve: Mildly thickened leaflets. Mild prolapse, involving   the anterior leaflet. There was severe regurgitation directed   posteriorly. Effective regurgitant orifice (PISA): 0.41 cm^2.   Regurgitant volume (PISA): 79 ml. -  Left atrium: The atrium was severely dilated. - Right ventricle: Systolic function was mildly reduced. - Right atrium: Central venous pressure (est): 3 mm Hg. - Atrial septum: No defect or patent foramen ovale was identified. - Tricuspid valve: There was mild regurgitation. - Pulmonary arteries: PA peak pressure: 43 mm Hg (S). - Pericardium, extracardiac: There was no pericardial effusion.  Assessment and Plan:  1. Chronic systolic heart failure (HCC)   2. Paroxysmal atrial fibrillation ([place])   3. Severe mitral regurgitation      1. Chronic systolic heart failure ([place]) Recent follow-up echocardiogram 01/07/2021 demonstrated EF  of 40%, LV global hypokinesis, LA severely dilated, RA mild to moderately dilated, mitral regurgitation jet is eccentric and posterior which may lead to underestimation of severity.  Mitral valve is abnormal.  Mild to moderate mitral valve regurgitation.  Aortic valve regurgitation mild to moderate.  Moderate dilatation of aortic root measuring 45 mm.  Moderate dilation of ascending aorta measuring 41 mm.  Patient states his shortness of breath is better since last visit.  States it takes longer for him to become short of breath when active.  He is not very active on a daily basis.  He walks with a cane.  Patient's son states they have been having trouble cutting the hydralazine dosage in half.  We discussed instead cutting the Toprol dose in half to 12.5 mg daily versus hydralazine.  Advised him to go back up to 25 mg on hydralazine and decrease dose of Toprol to 12.5.  Continue Isordil 20 mg p.o. twice daily.  Continue potassium 40 mEq p.o. twice daily.  2. Paroxysmal atrial fibrillation (HCC) Heart rate today 73 and irregular.  Continue Eliquis 5 mg p.o. twice daily.  Decrease Toprol-XL to 12.5 mg p.o. daily to help with lower blood pressures  3. Severe mitral regurgitation Recent echocardiogram on 01/07/2021 demonstrated mild to moderate mitral valve  regurgitation.  Medication Adjustments/Labs and Tests Ordered: Current medicines are reviewed at length with the patient today.  Concerns regarding medicines are outlined above.   Disposition: Follow-up with Dr. Harl Bowie or APP 6 months  Signed, Levell July, NP 03/16/2021 10:26 AM    [facility] at [place], [place], [place] [address] Phone: [phone]; Fax: [phone]

## 2021-03-16 ENCOUNTER — Ambulatory Visit (INDEPENDENT_AMBULATORY_CARE_PROVIDER_SITE_OTHER): Payer: Medicare PPO | Admitting: Family Medicine

## 2021-03-16 ENCOUNTER — Encounter: Payer: Self-pay | Admitting: Family Medicine

## 2021-03-16 VITALS — BP 118/68 | HR 73 | Ht 71.0 in | Wt 142.6 lb

## 2021-03-16 DIAGNOSIS — I480 Paroxysmal atrial fibrillation: Secondary | ICD-10-CM | POA: Diagnosis not present

## 2021-03-16 DIAGNOSIS — I5022 Chronic systolic (congestive) heart failure: Secondary | ICD-10-CM

## 2021-03-16 DIAGNOSIS — I340 Nonrheumatic mitral (valve) insufficiency: Secondary | ICD-10-CM | POA: Diagnosis not present

## 2021-03-16 MED ORDER — METOPROLOL SUCCINATE ER 25 MG PO TB24: 12.5000 mg | ORAL_TABLET | Freq: Every day | ORAL | Status: AC

## 2021-03-16 MED ORDER — HYDRALAZINE HCL 25 MG PO TABS: 25.0000 mg | ORAL_TABLET | Freq: Every day | ORAL | 6 refills | Status: AC

## 2021-03-16 NOTE — Patient Instructions (Signed)
Medication Instructions:  Continue the Hydralazine at 25mg  daily. Decrease the Toprol XL to 12.5mg  (1/2 tab) daily.  Continue all other medications.     Labwork: none  Testing/Procedures: none  Follow-Up: Your physician wants you to follow up in: 6 months.  You will receive a reminder letter in the mail one-two months in advance.  If you don't receive a letter, please call our office to schedule the follow up appointment   Any Other Special Instructions Will Be Listed Below (If Applicable).   If you need a refill on your cardiac medications before your next appointment, please call your pharmacy.

## 2021-06-30 ENCOUNTER — Telehealth: Payer: Self-pay | Admitting: Cardiology

## 2021-06-30 NOTE — Telephone Encounter (Signed)
Calling to say they his last ekg, last office report, and the mass in his chest that we are folliowing. Fax number [phone] ?

## 2022-04-26 DEATH — deceased

## 2022-07-07 NOTE — Telephone Encounter (Signed)
done
